# Patient Record
Sex: Female | Born: 1950 | Race: White | Hispanic: No | Marital: Married
Health system: Southern US, Community
[De-identification: ages and names within clinical notes are randomized; demographics above are authoritative.]

## PROBLEM LIST (undated history)

## (undated) DIAGNOSIS — M26629 Arthralgia of temporomandibular joint, unspecified side: Secondary | ICD-10-CM

## (undated) HISTORY — PX: BREAST EXCISIONAL BIOPSY: SUR124

---

## 1999-02-17 ENCOUNTER — Other Ambulatory Visit: Admission: RE | Admit: 1999-02-17 | Discharge: 1999-02-17 | Payer: Self-pay | Admitting: Family Medicine

## 1999-08-03 ENCOUNTER — Other Ambulatory Visit: Admission: RE | Admit: 1999-08-03 | Discharge: 1999-08-03 | Payer: Self-pay | Admitting: Obstetrics and Gynecology

## 1999-08-30 ENCOUNTER — Encounter: Admission: RE | Admit: 1999-08-30 | Discharge: 1999-08-30 | Payer: Self-pay | Admitting: Obstetrics and Gynecology

## 1999-08-30 ENCOUNTER — Encounter: Payer: Self-pay | Admitting: Obstetrics and Gynecology

## 1999-09-20 ENCOUNTER — Encounter (INDEPENDENT_AMBULATORY_CARE_PROVIDER_SITE_OTHER): Payer: Self-pay | Admitting: Specialist

## 1999-09-20 ENCOUNTER — Ambulatory Visit (HOSPITAL_COMMUNITY): Admission: RE | Admit: 1999-09-20 | Discharge: 1999-09-20 | Payer: Self-pay | Admitting: Obstetrics and Gynecology

## 2000-02-22 ENCOUNTER — Other Ambulatory Visit: Admission: RE | Admit: 2000-02-22 | Discharge: 2000-02-22 | Payer: Self-pay | Admitting: Obstetrics and Gynecology

## 2000-09-26 ENCOUNTER — Other Ambulatory Visit: Admission: RE | Admit: 2000-09-26 | Discharge: 2000-09-26 | Payer: Self-pay | Admitting: Obstetrics and Gynecology

## 2001-04-04 ENCOUNTER — Encounter: Admission: RE | Admit: 2001-04-04 | Discharge: 2001-04-04 | Payer: Self-pay | Admitting: Obstetrics and Gynecology

## 2001-04-04 ENCOUNTER — Encounter: Payer: Self-pay | Admitting: Obstetrics and Gynecology

## 2001-04-10 ENCOUNTER — Other Ambulatory Visit: Admission: RE | Admit: 2001-04-10 | Discharge: 2001-04-10 | Payer: Self-pay | Admitting: Obstetrics and Gynecology

## 2002-04-07 ENCOUNTER — Encounter: Payer: Self-pay | Admitting: Gynecology

## 2002-04-07 ENCOUNTER — Encounter: Admission: RE | Admit: 2002-04-07 | Discharge: 2002-04-07 | Payer: Self-pay | Admitting: Gynecology

## 2002-05-19 ENCOUNTER — Other Ambulatory Visit: Admission: RE | Admit: 2002-05-19 | Discharge: 2002-05-19 | Payer: Self-pay | Admitting: Gynecology

## 2003-08-17 ENCOUNTER — Other Ambulatory Visit: Admission: RE | Admit: 2003-08-17 | Discharge: 2003-08-17 | Payer: Self-pay | Admitting: Gynecology

## 2003-08-25 ENCOUNTER — Encounter: Admission: RE | Admit: 2003-08-25 | Discharge: 2003-08-25 | Payer: Self-pay | Admitting: Gynecology

## 2004-08-23 ENCOUNTER — Other Ambulatory Visit: Admission: RE | Admit: 2004-08-23 | Discharge: 2004-08-23 | Payer: Self-pay | Admitting: Gynecology

## 2004-08-26 ENCOUNTER — Encounter: Admission: RE | Admit: 2004-08-26 | Discharge: 2004-08-26 | Payer: Self-pay | Admitting: Gynecology

## 2005-08-30 ENCOUNTER — Other Ambulatory Visit: Admission: RE | Admit: 2005-08-30 | Discharge: 2005-08-30 | Payer: Self-pay | Admitting: Gynecology

## 2005-08-30 ENCOUNTER — Encounter: Admission: RE | Admit: 2005-08-30 | Discharge: 2005-08-30 | Payer: Self-pay | Admitting: Gynecology

## 2006-09-04 ENCOUNTER — Encounter: Admission: RE | Admit: 2006-09-04 | Discharge: 2006-09-04 | Payer: Self-pay | Admitting: Gynecology

## 2006-10-04 ENCOUNTER — Other Ambulatory Visit: Admission: RE | Admit: 2006-10-04 | Discharge: 2006-10-04 | Payer: Self-pay | Admitting: Gynecology

## 2007-10-07 ENCOUNTER — Encounter: Admission: RE | Admit: 2007-10-07 | Discharge: 2007-10-07 | Payer: Self-pay | Admitting: Gynecology

## 2008-02-24 ENCOUNTER — Other Ambulatory Visit: Admission: RE | Admit: 2008-02-24 | Discharge: 2008-02-24 | Payer: Self-pay | Admitting: Family Medicine

## 2008-03-05 ENCOUNTER — Encounter: Payer: Self-pay | Admitting: Emergency Medicine

## 2008-03-05 ENCOUNTER — Inpatient Hospital Stay (HOSPITAL_COMMUNITY): Admission: AD | Admit: 2008-03-05 | Discharge: 2008-03-08 | Payer: Self-pay | Admitting: Internal Medicine

## 2008-03-13 ENCOUNTER — Encounter: Admission: RE | Admit: 2008-03-13 | Discharge: 2008-03-13 | Payer: Self-pay | Admitting: Neurology

## 2008-10-30 ENCOUNTER — Encounter: Admission: RE | Admit: 2008-10-30 | Discharge: 2008-10-30 | Payer: Self-pay | Admitting: Family Medicine

## 2009-01-21 ENCOUNTER — Other Ambulatory Visit: Admission: RE | Admit: 2009-01-21 | Discharge: 2009-01-21 | Payer: Self-pay | Admitting: Family Medicine

## 2009-11-23 ENCOUNTER — Encounter: Admission: RE | Admit: 2009-11-23 | Discharge: 2009-11-23 | Payer: Self-pay | Admitting: Family Medicine

## 2010-01-25 ENCOUNTER — Other Ambulatory Visit: Admission: RE | Admit: 2010-01-25 | Discharge: 2010-01-25 | Payer: Self-pay | Admitting: Family Medicine

## 2010-10-04 NOTE — Discharge Summary (Signed)
[name], [name]                 ACCOUNT NO.:  [id number]   MEDICAL RECORD NO.:  [id number]          PATIENT TYPE:  INP   LOCATION:  3036                         FACILITY:  [facility]   PHYSICIAN:  Kela Millin, M.D.DATE OF BIRTH:  September 02, 1950   DATE OF ADMISSION:  03/05/2008  DATE OF DISCHARGE:  03/08/2008                               DISCHARGE SUMMARY   DISCHARGE DIAGNOSES:  1. 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  2. Hypokalemia - potassium replaced.  3. History of mitral valve prolapse.   CONSULTATIONS:  Neurology - Dr. Anne Hahn.   PROCEDURES AND STUDIES:  1. High-volume epidural blood patch.  2. MRI - no acute intracranial abnormality or focal lesion to account      for headaches.  3. MRA - normal variant.  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  4. CT scan of head without contrast - negative for bleed or other      acute intracranial process.   BRIEF HISTORY:  The patient is a 60 year old white female with the above  medical problems who presented with complaints of headaches.  She  reported that the headaches started while she was at the beach and later  on when she went to lie down, she then noticed that the headaches  improved and she was able to fall asleep and then subsequently it became  clear to her that sitting up made the headaches worse and laying down  made the headaches better.  She admitted to some nausea and vomiting x1.  On the day of admission, she woke up at 5 a.m. because of the severe  headache, and they decided to drive back from the beach and went to see  her primary care physician, who asked her to go to the ER.  She also was  complaining of some neck pain and in the ER wanted to do an LP but the  patient refused.  A CT scan was done and the results as stated above.  She was admitted for further evaluation and management.  She also  reported that she  had started having some difficulty hearing out of her  left ear in the few days prior to admission, and it just felt like it  needed to pop and she would be able to hear better.   Please see the full admission history and physical dictated on March 05, 2008 by Dr. Adela Glimpse for the details of admission physical exam as  well as the laboratory data.   HOSPITAL COURSE:  1. Headaches, traction type - upon admission the patient was placed on      analgesics for pain management.  An MRI was done and the results      are stated above - negative.  The patient continued to experience      the headaches which were worse with sitting up and also began      complaining of having difficulty hearing out of both ears.  Following the negative MRI, neurology was consulted and Dr. Anne Hahn      saw the patient and indicated that her headaches sounded like post      LP headaches, except that the patient had not had an LP.  He      initially indicated that he would still consider a lumbar puncture      to check the opening pressure, but again that the patient did not      want an LP.  Therefore, he recommended doing a high-volume epidural      blood patch and this was done and following this that the patient      has not had any further headaches, also no neck stiffness.  She was      seen on rounds today and has remained asymptomatic and      hemodynamically stable, and will be discharged home as per neuro      recommendations.  The patient has been instructed to try to keep      her head down today and then to get some on Monday, and then return      to work on Tuesday if no headaches.  2. Hypokalemia - her potassium was replaced in the hospital.   FOLLOW-UP CARE:  1. Dr. Gweneth Dimitri in 1 to 2 weeks.  2. Dr. Anne Hahn as needed.   DISCHARGE CONDITION:  Improved, stable.      Kela Millin, M.D.  Electronically Signed     ACV/MEDQ  D:  03/08/2008  T:  03/08/2008  Job:  [id number]   cc:    Pam Drown, M.D.  Marlan Palau, M.D.

## 2010-10-04 NOTE — Consult Note (Signed)
NAME:  CLARISE, CHACKO NO.:  [id number]   MEDICAL RECORD NO.:  [id number]          PATIENT TYPE:  INP   LOCATION:  3036                         FACILITY:  [facility]   PHYSICIAN:  Marlan Palau, M.D.  DATE OF BIRTH:  09-22-1950   DATE OF CONSULTATION:  03/06/2008  DATE OF DISCHARGE:                                 CONSULTATION   HISTORY OF PRESENT ILLNESS:  [name] is a 60 year old right-handed  white female born on 13-Feb-1951, with a history of headache that  began about 5 days prior to admission.  The patient initially noted a  mild tension-type headache in the head in the evening, took some  ibuprofen for it and went to bed and slept well.  The patient got up the  next morning and noted severe headache that was around the head  associated with some neck stiffness, particularly on the right side and  muffled hearing on the right greater than left side and eventually  muffled hearing involved the left as well.  The patient denied any ear  pain per se, but did note some neck stiffness and noted some pain down  the spine with the neck flexion.  The patient also noted that the  headache is worse with sitting or standing, better when lying down and  with coughing, the patient notes pain in the neck area.  The patient has  not any visual field changes, numbness or weakness on the arms or legs.  The patient has had some nausea and some occasional vomiting.  The  patient has no weakness of the extremities.  The patient comes to this  hospital for further evaluation.  MRI scan of the brain with and without  gadolinium enhancement and MRI angiogram of intracranial vessels have  been relatively unremarkable.  Lumbar puncture was suggested, but the  patient did not wish to have this test.  Neurology is asked to see the  patient for further evaluation.   PAST MEDICAL HISTORY:  Significant for:  1. New onset headache, traction type, etiology not clear.  2. Mitral valve  prolapse.  3. Left breast cyst biopsy.  4. Cervical biopsy.   The patient is on multivitamins prior to admission.  Again, the patient  does not smoke or drink.  She has no known allergies.   SOCIAL HISTORY:  This patient is married, lives in the [place], [place]  [place] area, has 2 children, works in a [employer].   FAMILY MEDICAL HISTORY:  Her mother is alive at 30 with stroke,  diabetes, hypertension, and atrial fibrillation.  Father, age 68 with  hypertension and congestive heart failure, still alive.  The patient has  2 sisters, 1 brother.  Brother has glaucoma and 1 sister has  hypertension.   REVIEW OF SYSTEMS:  Notable for no fevers or chills.  The patient denies  any significant history of headaches in the past.  Denies shortness of  breath, chest pains, or abdominal pain.  Some nausea is noted.  No  troubles controlling the bowels or bladder.  The patient  denies any  weakness or numbness on the arms or legs.  The gait disturbance and  blackout episodes.   PHYSICAL EXAMINATION:  VITAL SIGNS:  Blood pressure is currently 117/68,  heart rate 64, respiratory rate 16, and temperature is 99.1.  The  patient has been afebrile throughout the majority of the  hospitalization.  HEENT:  Head is atraumatic.  Eyes, pupils are equal, round, and reactive  to light.  Disks are flat.  No definite venous pulsations are seen.  NECK:  Supple.  No carotid bruits noted.  RESPIRATORY:  Clear.  CARDIOVASCULAR:  Regular rate and rhythm.  No obvious murmurs or rubs  noted.  EXTREMITIES:  Without significant edema.  NEUROLOGIC:  Cranial nerves as above.  Facial symmetry is present.  The  patient has good sensation of face to pinprick and soft touch  bilaterally.  She has good strength of facial muscle, motion of muscles  of head turn, and shoulder shrug bilaterally.  Speech is well  enunciated, not aphasic.  Motor testing reveals 5/5 strength in all  fours.  Good symmetric motor  tone is noted throughout.  Sensory testing  is intact to pinprick, soft touch, and vibratory sensation throughout.  The patient has good finger-nose-finger and heel-to-shin.  Gait was not  tested.  Again, no drift is seen in the arms.  Deep tendon reflexes  symmetric.  Normal toes downgoing bilaterally.   LABORATORY VALUES:  Notable for white count of 5.4, hemoglobin of 11.2,  hematocrit of 33.5, MCV of 90.0, and platelets of 145.  Sodium 137,  potassium 3.1, chloride of 104, CO2 of 27, glucose of 91, BUN of 7,  creatinine 0.74, total bili of 0.5, alk phosphatase 45, SGOT of 21, SGPT  of 16, total protein 6.5, albumin of 3.4, calcium of 8.7, phos of 3.5,  LDH of 103, magnesium 1.6, hemoglobin A1c of 5.4, cholesterol of 150,  triglycerides 54, HDL 45, LDL of 94, VLDL of 11, TSH of 3.396.  Urinalysis reveals specific gravity of 1.020, pH of 5.5, ketones 15  mg/dL, 3-6 white cells, and 0-2 red cells.   MRI is as above.   IMPRESSION:  History of headache of traction type, new onset.   The patient has a headache which is most consistent with a post-LP  headache, but the patient has not had a lumbar puncture.  MRI scan of  the brain does not show typical findings that one would expect with  spontaneous intracranial hypotension but the symptoms have only been  present for a few days.  I think this is still a diagnostic possibility.  I suppose that a small subarachnoid hemorrhage or low-grade aseptic  meningitis do need to be considered, but headaches with this generally  would not improve with lying down and get worse with sitting or  standing.  The patient does not wish to have a LP.   PLAN:  1. I would can still consider lumbar puncture and check opening      pressure.  2. IV fluid hydration.  3. Bedrest, head down.  4. Analgesics for pain.  We will review the MRI scan with the      radiologist.  If spontaneous intracranial hypotension remains a      possibility, a large volume blood  epidural may be beneficial.      C. Lesia Sago, M.D.  Electronically Signed     CKW/MEDQ  D:  03/06/2008  T:  03/07/2008  Job:  [id number]   cc:  [facility]

## 2010-10-04 NOTE — H&P (Signed)
NAMEPETRINA, MELBY NO.:  [id number]   MEDICAL RECORD NO.:  [id number]          PATIENT TYPE:  INP   LOCATION:  3036                         FACILITY:  [facility]   PHYSICIAN:  Michiel Cowboy, MDDATE OF BIRTH:  May 10, 1951   DATE OF ADMISSION:  03/05/2008  DATE OF DISCHARGE:                              HISTORY & PHYSICAL   PRIMARY CARE PHYSICIAN:  Pam Drown, M.D., [facility]   CHIEF COMPLAINT:  Headache.   The patient is a 60 year old female with no significant past medical  history, who was resting at the beach.  She was resting at the beach and  noticed while there she started to develop headache before she was going  to go to bed on Tuesday; she thought it was kind of a tension headache.  She took some ibuprofen.  At first there was probably some minimal  improvement and she was able to fall asleep.  She noticed that when she  was sitting up the headache would get worse, but when she would lay down  it would get better, particularly laying down on her stomach seemed to  make the headache go way.  She was able to rest more or less throughout  the day and tolerated the headache with occasional ibuprofen and  Tylenol; but the headache persisted, so that when she woke up she was  aware of it.  She had some nausea, actually vomited yesterday.  This  morning at about 5 o'clock in the morning she woke up because of her  headache was severe, which prompted her to drive back from the beach and  present to her regular physician; who instructed her to go to the  emergency department.  There initial lab work and CT scan was  unremarkable.  Because she was endorsing some neck pain.  They were  thinking about going ahead and doing a LP, but the patient refused; at  which point [facility] was called for an admission for further  observation.  The patient does drink caffeinated drinks, usually in the  morning.  She has not been drinking  much of that  while on vacation, but  she thought about that and went ahead and got herself a caffeinated  drink.  She thought this would help, but with no results.   Here the pain is relieved today with Percocet or morphine,  and  currently the patient is actually doing somewhat better.  She is not  nauseous.  She denies any fevers or chills.  She has not had chest pain  or shortness of breath.  Otherwise, review of systems were unremarkable  and no diarrhea.  Although, as I stated before, there was an episode of  nausea and vomiting.  She feels like her neck was a little bit more; she  had some pain in the back of her neck but able to move her neck in all  directions without trouble.  The patient has been noticing that she had  some difficulty in hearing from her left ear for the past few days too.  She feels  like she just needs to pop it and it will make it better.   PAST MEDICAL HISTORY:  Noncontributory.   SOCIAL HISTORY:  The patient never drinks alcohol and does not use  drugs.  Lives at home.  Has a very well involved family.  She does not  smoke and never did.   FAMILY HISTORY:  Noncontributory.   MEDICATIONS:  1. Tylenol and ibuprofen p.r.n.  2. Multivitamin.   ALLERGIES:  NO KNOWN DRUG ALLERGIES.   PHYSICAL EXAMINATION:  VITALS:  Temperature 99.5, heart rate 69,  respirations 18, blood pressure 110/66, saturations 97% on room air.  The patient appears to be in no acute distress currently and is resting  in bed.  HEAD:  Nontraumatic.  Somewhat dryish mucous membranes.  Palpation of  the head does not reproduce pain.  Palpation of her mastoid does not  reproduce pain.  The oropharynx does not seem to have any erythema or  exudates.  LUNGS:  Clear to auscultation bilaterally.  HEART:  Regular rate and rhythm.  No murmurs, rubs or gallops.  ABDOMEN:  Soft, nontender, nondistended.  EXTREMITIES:  No clubbing, cyanosis or edema.  NEUROLOGIC:  Strength 5 out of 5 in all 4 extremities.   Cranial II-XII  intact.  The neurological exam is completely nonfocal.   LABS:  White blood cell count 6.5, but with increased neutrophils at  81%, hemoglobin 12.2.  Sodium 144, potassium 3.5, creatinine 0.8.  LFTs  within normal limits.  EKG within normal limits.  Chest x-ray showing no  cardiopulmonary disease.  CT scan of the head negative for any  intracranial lesions, bleeding or remote strokes.   ASSESSMENT AND PLAN:  This is a 60 year old with new onset severe  headaches.  Etiology is unclear.  1. Headache.  I wonder if it is possible this is caffeine withdrawal      headache versus tension headache; but given the severity of it and      persistence, we will need to make sure that we rule out any      intracranial abnormality.  Will obtain MRI of the brain in the a.m.      Given some degree in hearing, I wonder if there is a possibility of      herpetic lesion in her ear; although not visible. but she would      probably need a further evaluation with an otoscope to see if there      is any herpetic lesions.  Also, that raises the possibility of      herpetic encephalitis.  The MRI should be able to help [place] determine      that.  If the patient becomes more toxic, for which she is not      currently, would need a LP.  We discussed this with the family and      the patient,  who states that yes if she will get sicker she would      be alright to have a LP; she just refuses to get it at this point.      Will control pain with Percocet.  Given that the patient is above      50, there is a possibility of temporal arteritis; although again,      the story is not very convincing.  We will check sed rate and also      check TSH and hemoglobin A1c -- although the presentation is not      very typical,  The patient appears to be clinically slightly      dehydrated.  I wonder if this is related to her headache.  We will      give her IV fluids and follow.   PROPHYLAXIS:  Protonix and  SCDs.      Michiel Cowboy, MD  Electronically Signed     AVD/MEDQ  D:  03/05/2008  T:  03/05/2008  Job:  [id number]   cc:   Pam Drown, M.D.

## 2010-10-07 NOTE — Op Note (Signed)
[facility]  Patient:    [name], [name]                        MRN: [id number] Proc. Date: 09/20/99 Adm. Date:  [id number] Disc. 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  Plus multiple endometrial polyps, history of irregular spotting in the last six weeks.  OPERATION:                    Cold knife conization; hysteroscopy and removal of multiple polyps in the endometrium.  SURGEON:                      Esmeralda Arthur, M.D.  ASSISTANT:  ANESTHESIA:                   General anesthesia.  PACKS:                        She had Surgicel in the cervical canal.  CATHETERS:                    None.  MEDIUM:                       Sorbitol with a deficit of 10 cc.  ESTIMATED BLOOD LOSS:  DESCRIPTION OF PROCEDURE:     The patient was carried to the operating room and  after satisfactory general anesthesia, she was prepped and draped in a sterile field and the bladder was emptied by catheterization.  Bimanual examination revealed the uterus to be posterior, no masses felt in the  adnexa.  A weighted speculum was placed in the posterior vagina.  The cervix was grasped at 3 and 9 with two single tooth tenaculi.  The cervix was then stained with lugols and took up the stain.  The cervix was then injected with 20 drops of vasopressin in 100 cc and 2 cc were injected at 3, 6, 9, and we lost some at 12, so I injected more.  We then took the Bard-Parker and starting at 6 oclock went counterclockwise to 2 and then at 6 oclock clockwise to 12.  The anterior and posterior lips of the cervix of the cone was then grasped and the remainder of the dissection  was done with scissors.  I did not get as much endocervical canal posteriorly as I would  like to, but we did get it and removed it.  We tagged the cone at 12 oclock and  sent it fresh to the lab.  We then dilated the cervix to 23 Hegar and inserted the hysteroscope and initially hard to see because she was so posterior, but we found that she had multiple polyps and these were removed with Randall stonegrasping forceps and then we had a curettement.  I looked again and I could see no polyps.  We then did needlepoint cautery on the cervix.  We then put sutures from 1 to 5 and 7 to 10  and #1 chromic and tied these.  I then looked at the cervix and could see no bleeding after a minute.  We then packed the cervix with Surgicel.  The patient was carried to the recovery room in good condition. DD:  09/20/99 TD:  09/22/99 Job: [id number]

## 2010-11-24 ENCOUNTER — Other Ambulatory Visit: Payer: Self-pay | Admitting: Family Medicine

## 2010-11-24 DIAGNOSIS — Z1231 Encounter for screening mammogram for malignant neoplasm of breast: Secondary | ICD-10-CM

## 2010-11-28 ENCOUNTER — Ambulatory Visit
Admission: RE | Admit: 2010-11-28 | Discharge: 2010-11-28 | Disposition: A | Discharge status: Home / Self Care | Payer: 59 | Source: Ambulatory Visit | Attending: Family Medicine | Admitting: Family Medicine

## 2010-11-28 ENCOUNTER — Other Ambulatory Visit: Payer: Self-pay | Admitting: Family Medicine

## 2010-11-28 DIAGNOSIS — Z1231 Encounter for screening mammogram for malignant neoplasm of breast: Secondary | ICD-10-CM

## 2011-01-30 ENCOUNTER — Other Ambulatory Visit: Payer: Self-pay | Admitting: Family Medicine

## 2011-01-30 DIAGNOSIS — N9489 Other specified conditions associated with female genital organs and menstrual cycle: Secondary | ICD-10-CM

## 2011-02-07 ENCOUNTER — Other Ambulatory Visit: Payer: 59

## 2011-02-09 ENCOUNTER — Other Ambulatory Visit: Payer: 59

## 2011-02-09 ENCOUNTER — Ambulatory Visit
Admission: RE | Admit: 2011-02-09 | Discharge: 2011-02-09 | Disposition: A | Discharge status: Home / Self Care | Payer: 59 | Source: Ambulatory Visit | Attending: Family Medicine | Admitting: Family Medicine

## 2011-02-09 DIAGNOSIS — N9489 Other specified conditions associated with female genital organs and menstrual cycle: Secondary | ICD-10-CM

## 2011-02-21 LAB — DIFFERENTIAL
Basophils Absolute: 0
Basophils Absolute: 0
Basophils Relative: 0
Eosinophils Absolute: 0
Lymphocytes Relative: 13
Lymphocytes Relative: 13
Lymphs Abs: 0.7
Monocytes Absolute: 0.3
Monocytes Relative: 6
Neutro Abs: 5.3

## 2011-02-21 LAB — BASIC METABOLIC PANEL
CO2: 30
Chloride: 104
GFR calc Af Amer: >60
Sodium: 141

## 2011-02-21 LAB — URINE CULTURE

## 2011-02-21 LAB — URINE MICROSCOPIC-ADD ON

## 2011-02-21 LAB — URINALYSIS, ROUTINE W REFLEX MICROSCOPIC
Bilirubin Urine: NEGATIVE
Glucose, UA: NEGATIVE
Nitrite: NEGATIVE
Specific Gravity, Urine: 1.02
pH: 5.5

## 2011-02-21 LAB — CBC
HCT: 35.2 — ABNORMAL LOW
HCT: 35.6 — ABNORMAL LOW
Hemoglobin: 11.2 — ABNORMAL LOW
Hemoglobin: 11.8 — ABNORMAL LOW
MCHC: 33.2
MCHC: 34.6
MCV: 87.3
MCV: 89.9
Platelets: 193
RBC: 3.95
RDW: 12.3
WBC: 5.4

## 2011-02-21 LAB — SEDIMENTATION RATE: Sed Rate: 15

## 2011-02-21 LAB — COMPREHENSIVE METABOLIC PANEL
ALT: 16
Albumin: 3.4 — ABNORMAL LOW
Albumin: 4.1
BUN: 7
BUN: 9
Chloride: 104
Chloride: 106
Creatinine, Ser: 0.74
Creatinine, Ser: 0.8
Glucose, Bld: 91
Sodium: 137
Total Bilirubin: 0.5
Total Bilirubin: 0.5
Total Protein: 7.6

## 2011-02-21 LAB — LIPID PANEL
LDL Cholesterol: 94
VLDL: 11

## 2011-02-21 LAB — APTT: aPTT: 26

## 2011-02-21 LAB — MAGNESIUM: Magnesium: 1.6

## 2011-02-21 LAB — HEMOGLOBIN A1C: Hgb A1c MFr Bld: 5.4

## 2011-02-21 LAB — PHOSPHORUS: Phosphorus: 3.5

## 2011-02-21 LAB — LACTATE DEHYDROGENASE: LDH: 103

## 2011-02-21 LAB — TSH: TSH: 3.396

## 2011-12-21 ENCOUNTER — Other Ambulatory Visit: Payer: Self-pay | Admitting: Family Medicine

## 2011-12-21 DIAGNOSIS — Z1231 Encounter for screening mammogram for malignant neoplasm of breast: Secondary | ICD-10-CM

## 2012-01-05 ENCOUNTER — Ambulatory Visit: Payer: 59

## 2012-01-16 ENCOUNTER — Ambulatory Visit
Admission: RE | Admit: 2012-01-16 | Discharge: 2012-01-16 | Disposition: A | Discharge status: Home / Self Care | Payer: BC Managed Care – PPO | Source: Ambulatory Visit | Attending: Family Medicine | Admitting: Family Medicine

## 2012-01-16 DIAGNOSIS — Z1231 Encounter for screening mammogram for malignant neoplasm of breast: Secondary | ICD-10-CM

## 2012-10-07 ENCOUNTER — Other Ambulatory Visit (HOSPITAL_COMMUNITY)
Admission: RE | Admit: 2012-10-07 | Discharge: 2012-10-07 | Disposition: A | Discharge status: Home / Self Care | Payer: BC Managed Care – PPO | Source: Ambulatory Visit | Attending: Family Medicine | Admitting: Family Medicine

## 2012-10-07 ENCOUNTER — Other Ambulatory Visit: Payer: Self-pay | Admitting: Family Medicine

## 2012-10-07 DIAGNOSIS — Z0000 Encounter for general adult medical examination without abnormal findings: Secondary | ICD-10-CM | POA: Insufficient documentation

## 2012-10-07 DIAGNOSIS — Z1151 Encounter for screening for human papillomavirus (HPV): Secondary | ICD-10-CM | POA: Insufficient documentation

## 2013-05-06 ENCOUNTER — Other Ambulatory Visit: Payer: Self-pay

## 2013-05-06 DIAGNOSIS — Z1231 Encounter for screening mammogram for malignant neoplasm of breast: Secondary | ICD-10-CM

## 2013-05-14 ENCOUNTER — Ambulatory Visit
Admission: RE | Admit: 2013-05-14 | Discharge: 2013-05-14 | Disposition: A | Discharge status: Home / Self Care | Payer: BC Managed Care – PPO | Source: Ambulatory Visit

## 2013-05-14 DIAGNOSIS — Z1231 Encounter for screening mammogram for malignant neoplasm of breast: Secondary | ICD-10-CM

## 2014-04-23 ENCOUNTER — Other Ambulatory Visit: Payer: Self-pay

## 2014-04-23 DIAGNOSIS — Z1231 Encounter for screening mammogram for malignant neoplasm of breast: Secondary | ICD-10-CM

## 2014-05-18 ENCOUNTER — Ambulatory Visit
Admission: RE | Admit: 2014-05-18 | Discharge: 2014-05-18 | Disposition: A | Discharge status: Home / Self Care | Payer: BC Managed Care – PPO | Source: Ambulatory Visit

## 2014-05-18 DIAGNOSIS — Z1231 Encounter for screening mammogram for malignant neoplasm of breast: Secondary | ICD-10-CM

## 2015-05-14 ENCOUNTER — Other Ambulatory Visit: Payer: Self-pay

## 2015-05-14 DIAGNOSIS — Z1231 Encounter for screening mammogram for malignant neoplasm of breast: Secondary | ICD-10-CM

## 2015-06-14 ENCOUNTER — Ambulatory Visit
Admission: RE | Admit: 2015-06-14 | Discharge: 2015-06-14 | Disposition: A | Discharge status: Home / Self Care | Payer: BLUE CROSS/BLUE SHIELD | Source: Ambulatory Visit

## 2015-06-14 DIAGNOSIS — Z1231 Encounter for screening mammogram for malignant neoplasm of breast: Secondary | ICD-10-CM

## 2016-07-20 ENCOUNTER — Other Ambulatory Visit: Payer: Self-pay | Admitting: Family Medicine

## 2016-07-20 DIAGNOSIS — Z1231 Encounter for screening mammogram for malignant neoplasm of breast: Secondary | ICD-10-CM

## 2016-08-07 ENCOUNTER — Ambulatory Visit
Admission: RE | Admit: 2016-08-07 | Discharge: 2016-08-07 | Disposition: A | Discharge status: Home / Self Care | Payer: BLUE CROSS/BLUE SHIELD | Source: Ambulatory Visit | Attending: Family Medicine | Admitting: Family Medicine

## 2016-08-07 DIAGNOSIS — Z1231 Encounter for screening mammogram for malignant neoplasm of breast: Secondary | ICD-10-CM

## 2016-08-09 ENCOUNTER — Other Ambulatory Visit: Payer: Self-pay | Admitting: Family Medicine

## 2016-08-09 DIAGNOSIS — R928 Other abnormal and inconclusive findings on diagnostic imaging of breast: Secondary | ICD-10-CM

## 2016-08-15 ENCOUNTER — Ambulatory Visit
Admission: RE | Admit: 2016-08-15 | Discharge: 2016-08-15 | Disposition: A | Discharge status: Home / Self Care | Payer: BLUE CROSS/BLUE SHIELD | Source: Ambulatory Visit | Attending: Family Medicine | Admitting: Family Medicine

## 2016-08-15 DIAGNOSIS — R928 Other abnormal and inconclusive findings on diagnostic imaging of breast: Secondary | ICD-10-CM

## 2017-08-10 ENCOUNTER — Other Ambulatory Visit: Payer: Self-pay | Admitting: Family Medicine

## 2017-08-10 DIAGNOSIS — Z1231 Encounter for screening mammogram for malignant neoplasm of breast: Secondary | ICD-10-CM

## 2017-08-20 DIAGNOSIS — R69 Illness, unspecified: Secondary | ICD-10-CM | POA: Diagnosis not present

## 2017-08-20 DIAGNOSIS — D1039 Benign neoplasm of other parts of mouth: Secondary | ICD-10-CM | POA: Diagnosis not present

## 2017-09-10 ENCOUNTER — Other Ambulatory Visit: Payer: Self-pay | Admitting: Oral Surgery

## 2017-09-10 DIAGNOSIS — K068 Other specified disorders of gingiva and edentulous alveolar ridge: Secondary | ICD-10-CM | POA: Diagnosis not present

## 2017-09-10 DIAGNOSIS — D1039 Benign neoplasm of other parts of mouth: Secondary | ICD-10-CM | POA: Diagnosis not present

## 2017-09-12 ENCOUNTER — Ambulatory Visit
Admission: RE | Admit: 2017-09-12 | Discharge: 2017-09-12 | Disposition: A | Discharge status: Home / Self Care | Payer: Medicare HMO | Source: Ambulatory Visit | Attending: Family Medicine | Admitting: Family Medicine

## 2017-09-12 DIAGNOSIS — Z1231 Encounter for screening mammogram for malignant neoplasm of breast: Secondary | ICD-10-CM

## 2017-11-05 ENCOUNTER — Other Ambulatory Visit (HOSPITAL_COMMUNITY)
Admission: RE | Admit: 2017-11-05 | Discharge: 2017-11-05 | Disposition: A | Discharge status: Home / Self Care | Payer: Medicare HMO | Source: Ambulatory Visit | Attending: Family Medicine | Admitting: Family Medicine

## 2017-11-05 ENCOUNTER — Other Ambulatory Visit: Payer: Self-pay | Admitting: Family Medicine

## 2017-11-05 DIAGNOSIS — Z01419 Encounter for gynecological examination (general) (routine) without abnormal findings: Secondary | ICD-10-CM | POA: Diagnosis not present

## 2017-11-05 DIAGNOSIS — Z124 Encounter for screening for malignant neoplasm of cervix: Secondary | ICD-10-CM | POA: Diagnosis not present

## 2017-11-05 DIAGNOSIS — Z1159 Encounter for screening for other viral diseases: Secondary | ICD-10-CM | POA: Diagnosis not present

## 2017-11-05 DIAGNOSIS — Z131 Encounter for screening for diabetes mellitus: Secondary | ICD-10-CM | POA: Diagnosis not present

## 2017-11-05 DIAGNOSIS — R6884 Jaw pain: Secondary | ICD-10-CM | POA: Diagnosis not present

## 2017-11-05 DIAGNOSIS — Z23 Encounter for immunization: Secondary | ICD-10-CM | POA: Diagnosis not present

## 2017-11-05 DIAGNOSIS — Z0000 Encounter for general adult medical examination without abnormal findings: Secondary | ICD-10-CM | POA: Diagnosis not present

## 2017-11-05 DIAGNOSIS — E559 Vitamin D deficiency, unspecified: Secondary | ICD-10-CM | POA: Diagnosis not present

## 2017-11-07 LAB — CYTOLOGY - PAP: Diagnosis: NEGATIVE

## 2017-11-15 DIAGNOSIS — G500 Trigeminal neuralgia: Secondary | ICD-10-CM | POA: Diagnosis not present

## 2017-11-15 DIAGNOSIS — G44099 Other trigeminal autonomic cephalgias (TAC), not intractable: Secondary | ICD-10-CM | POA: Diagnosis not present

## 2017-11-15 DIAGNOSIS — G509 Disorder of trigeminal nerve, unspecified: Secondary | ICD-10-CM | POA: Diagnosis not present

## 2017-12-26 DIAGNOSIS — R69 Illness, unspecified: Secondary | ICD-10-CM | POA: Diagnosis not present

## 2018-01-17 DIAGNOSIS — R69 Illness, unspecified: Secondary | ICD-10-CM | POA: Diagnosis not present

## 2018-02-14 DIAGNOSIS — Z131 Encounter for screening for diabetes mellitus: Secondary | ICD-10-CM | POA: Diagnosis not present

## 2018-02-14 DIAGNOSIS — Z0000 Encounter for general adult medical examination without abnormal findings: Secondary | ICD-10-CM | POA: Diagnosis not present

## 2018-02-14 DIAGNOSIS — Z124 Encounter for screening for malignant neoplasm of cervix: Secondary | ICD-10-CM | POA: Diagnosis not present

## 2018-02-14 DIAGNOSIS — E559 Vitamin D deficiency, unspecified: Secondary | ICD-10-CM | POA: Diagnosis not present

## 2018-02-14 DIAGNOSIS — Z1159 Encounter for screening for other viral diseases: Secondary | ICD-10-CM | POA: Diagnosis not present

## 2018-02-14 DIAGNOSIS — Z23 Encounter for immunization: Secondary | ICD-10-CM | POA: Diagnosis not present

## 2018-02-14 DIAGNOSIS — R6884 Jaw pain: Secondary | ICD-10-CM | POA: Diagnosis not present

## 2018-07-03 DIAGNOSIS — R69 Illness, unspecified: Secondary | ICD-10-CM | POA: Diagnosis not present

## 2018-07-08 DIAGNOSIS — H52201 Unspecified astigmatism, right eye: Secondary | ICD-10-CM | POA: Diagnosis not present

## 2018-07-08 DIAGNOSIS — H52202 Unspecified astigmatism, left eye: Secondary | ICD-10-CM | POA: Diagnosis not present

## 2018-08-02 ENCOUNTER — Other Ambulatory Visit: Payer: Self-pay | Admitting: Family Medicine

## 2018-08-02 DIAGNOSIS — E2839 Other primary ovarian failure: Secondary | ICD-10-CM

## 2018-08-02 DIAGNOSIS — Z1231 Encounter for screening mammogram for malignant neoplasm of breast: Secondary | ICD-10-CM

## 2018-09-23 ENCOUNTER — Other Ambulatory Visit: Payer: Medicare HMO

## 2018-09-23 ENCOUNTER — Ambulatory Visit: Payer: Medicare HMO

## 2018-11-06 ENCOUNTER — Ambulatory Visit
Admission: RE | Admit: 2018-11-06 | Discharge: 2018-11-06 | Disposition: A | Discharge status: Home / Self Care | Payer: Medicare HMO | Source: Ambulatory Visit | Attending: Family Medicine | Admitting: Family Medicine

## 2018-11-06 ENCOUNTER — Other Ambulatory Visit: Payer: Self-pay

## 2018-11-06 DIAGNOSIS — Z780 Asymptomatic menopausal state: Secondary | ICD-10-CM | POA: Diagnosis not present

## 2018-11-06 DIAGNOSIS — Z1231 Encounter for screening mammogram for malignant neoplasm of breast: Secondary | ICD-10-CM | POA: Diagnosis not present

## 2018-11-06 DIAGNOSIS — M85851 Other specified disorders of bone density and structure, right thigh: Secondary | ICD-10-CM | POA: Diagnosis not present

## 2018-11-06 DIAGNOSIS — E2839 Other primary ovarian failure: Secondary | ICD-10-CM

## 2019-01-01 DIAGNOSIS — R69 Illness, unspecified: Secondary | ICD-10-CM | POA: Diagnosis not present

## 2019-02-05 DIAGNOSIS — R69 Illness, unspecified: Secondary | ICD-10-CM | POA: Diagnosis not present

## 2019-03-07 DIAGNOSIS — R69 Illness, unspecified: Secondary | ICD-10-CM | POA: Diagnosis not present

## 2019-03-13 DIAGNOSIS — Z23 Encounter for immunization: Secondary | ICD-10-CM | POA: Diagnosis not present

## 2019-03-13 DIAGNOSIS — E559 Vitamin D deficiency, unspecified: Secondary | ICD-10-CM | POA: Diagnosis not present

## 2019-03-13 DIAGNOSIS — G500 Trigeminal neuralgia: Secondary | ICD-10-CM | POA: Diagnosis not present

## 2019-03-13 DIAGNOSIS — Z0000 Encounter for general adult medical examination without abnormal findings: Secondary | ICD-10-CM | POA: Diagnosis not present

## 2019-03-13 DIAGNOSIS — Z131 Encounter for screening for diabetes mellitus: Secondary | ICD-10-CM | POA: Diagnosis not present

## 2019-03-13 DIAGNOSIS — Z1389 Encounter for screening for other disorder: Secondary | ICD-10-CM | POA: Diagnosis not present

## 2019-04-11 ENCOUNTER — Other Ambulatory Visit: Payer: Self-pay

## 2019-04-11 DIAGNOSIS — Z20822 Contact with and (suspected) exposure to covid-19: Secondary | ICD-10-CM

## 2019-04-13 LAB — NOVEL CORONAVIRUS, NAA: SARS-CoV-2, NAA: NOT DETECTED

## 2019-04-15 DIAGNOSIS — R69 Illness, unspecified: Secondary | ICD-10-CM | POA: Diagnosis not present

## 2019-05-05 DIAGNOSIS — R69 Illness, unspecified: Secondary | ICD-10-CM | POA: Diagnosis not present

## 2019-06-20 ENCOUNTER — Ambulatory Visit: Payer: Medicare HMO

## 2019-06-28 ENCOUNTER — Ambulatory Visit: Payer: Medicare HMO | Attending: Internal Medicine

## 2019-06-28 DIAGNOSIS — Z23 Encounter for immunization: Secondary | ICD-10-CM | POA: Insufficient documentation

## 2019-06-28 NOTE — Progress Notes (Signed)
   Covid-19 Vaccination Clinic  Name:  [name]    MRN: [id number] DOB: 1951/03/21  06/28/2019  [name] was observed post Covid-19 immunization for 15 minutes without incidence. She was provided with Vaccine Information Sheet and instruction to access the V-Safe system.   [name] was instructed to call 911 with any severe reactions post vaccine: Marland Kitchen Difficulty breathing  . Swelling of your face and throat  . A fast heartbeat  . A bad rash all over your body  . Dizziness and weakness    Immunizations Administered    Name Date Dose VIS Date Route   Pfizer COVID-19 Vaccine 06/28/2019 12:49 PM 0.3 mL 05/02/2019 Intramuscular   Manufacturer: [place]   Lot: [id number]   [place]: [id number]

## 2019-07-01 ENCOUNTER — Ambulatory Visit: Payer: Medicare HMO

## 2019-07-11 DIAGNOSIS — Z0100 Encounter for examination of eyes and vision without abnormal findings: Secondary | ICD-10-CM | POA: Diagnosis not present

## 2019-07-23 ENCOUNTER — Ambulatory Visit: Payer: Medicare HMO | Attending: Internal Medicine

## 2019-07-23 DIAGNOSIS — Z23 Encounter for immunization: Secondary | ICD-10-CM | POA: Insufficient documentation

## 2019-07-23 NOTE — Progress Notes (Signed)
   Covid-19 Vaccination Clinic  Name:  [name]    MRN: [id number] DOB: [birth year]  07/23/2019  [name] was observed post Covid-19 immunization for 15 minutes without incident. She was provided with Vaccine Information Sheet and instruction to access the V-Safe system.   [name] was instructed to call 911 with any severe reactions post vaccine: Marland Kitchen Difficulty breathing  . Swelling of face and throat  . A fast heartbeat  . A bad rash all over body  . Dizziness and weakness   Immunizations Administered    Name Date Dose VIS Date Route   Pfizer COVID-19 Vaccine 07/23/2019  2:56 PM 0.3 mL 05/02/2019 Intramuscular   Manufacturer: [place]   Lot: [id number]   [place]: [id number]

## 2019-08-15 DIAGNOSIS — G500 Trigeminal neuralgia: Secondary | ICD-10-CM | POA: Diagnosis not present

## 2019-09-04 DIAGNOSIS — R69 Illness, unspecified: Secondary | ICD-10-CM | POA: Diagnosis not present

## 2019-10-27 DIAGNOSIS — G500 Trigeminal neuralgia: Secondary | ICD-10-CM | POA: Diagnosis not present

## 2019-10-28 DIAGNOSIS — G500 Trigeminal neuralgia: Secondary | ICD-10-CM | POA: Diagnosis not present

## 2019-10-28 DIAGNOSIS — Z510 Encounter for antineoplastic radiation therapy: Secondary | ICD-10-CM | POA: Diagnosis not present

## 2020-01-02 ENCOUNTER — Other Ambulatory Visit: Payer: Self-pay | Admitting: Family Medicine

## 2020-01-02 DIAGNOSIS — Z1231 Encounter for screening mammogram for malignant neoplasm of breast: Secondary | ICD-10-CM

## 2020-01-09 DIAGNOSIS — H2513 Age-related nuclear cataract, bilateral: Secondary | ICD-10-CM | POA: Diagnosis not present

## 2020-01-09 DIAGNOSIS — H40013 Open angle with borderline findings, low risk, bilateral: Secondary | ICD-10-CM | POA: Diagnosis not present

## 2020-01-09 DIAGNOSIS — H04123 Dry eye syndrome of bilateral lacrimal glands: Secondary | ICD-10-CM | POA: Diagnosis not present

## 2020-01-13 ENCOUNTER — Other Ambulatory Visit: Payer: Self-pay

## 2020-01-13 ENCOUNTER — Ambulatory Visit
Admission: RE | Admit: 2020-01-13 | Discharge: 2020-01-13 | Disposition: A | Discharge status: Home / Self Care | Payer: Medicare HMO | Source: Ambulatory Visit | Attending: Family Medicine | Admitting: Family Medicine

## 2020-01-13 DIAGNOSIS — Z1231 Encounter for screening mammogram for malignant neoplasm of breast: Secondary | ICD-10-CM | POA: Diagnosis not present

## 2020-02-17 DIAGNOSIS — Z20828 Contact with and (suspected) exposure to other viral communicable diseases: Secondary | ICD-10-CM | POA: Diagnosis not present

## 2020-03-04 DIAGNOSIS — G500 Trigeminal neuralgia: Secondary | ICD-10-CM | POA: Diagnosis not present

## 2020-03-04 DIAGNOSIS — Z923 Personal history of irradiation: Secondary | ICD-10-CM | POA: Diagnosis not present

## 2020-03-04 DIAGNOSIS — Z48811 Encounter for surgical aftercare following surgery on the nervous system: Secondary | ICD-10-CM | POA: Diagnosis not present

## 2020-03-06 ENCOUNTER — Other Ambulatory Visit: Payer: Self-pay

## 2020-03-06 ENCOUNTER — Ambulatory Visit: Payer: Medicare HMO | Attending: Internal Medicine

## 2020-03-06 DIAGNOSIS — Z23 Encounter for immunization: Secondary | ICD-10-CM

## 2020-03-06 NOTE — Progress Notes (Signed)
   Covid-19 Vaccination Clinic  Name:  Corissa Oguinn    MRN: [id number] DOB: July 06, 1950  03/06/2020  Ms. Soderholm was observed post Covid-19 immunization for 15 minutes without incident. She was provided with Vaccine Information Sheet and instruction to access the V-Safe system.   Ms. Mcauliff was instructed to call 911 with any severe reactions post vaccine: Marland Kitchen Difficulty breathing  . Swelling of face and throat  . A fast heartbeat  . A bad rash all over body  . Dizziness and weakness

## 2020-03-10 DIAGNOSIS — Z131 Encounter for screening for diabetes mellitus: Secondary | ICD-10-CM | POA: Diagnosis not present

## 2020-03-10 DIAGNOSIS — E559 Vitamin D deficiency, unspecified: Secondary | ICD-10-CM | POA: Diagnosis not present

## 2020-03-15 DIAGNOSIS — Z01419 Encounter for gynecological examination (general) (routine) without abnormal findings: Secondary | ICD-10-CM | POA: Diagnosis not present

## 2020-03-15 DIAGNOSIS — G500 Trigeminal neuralgia: Secondary | ICD-10-CM | POA: Diagnosis not present

## 2020-03-15 DIAGNOSIS — Z0000 Encounter for general adult medical examination without abnormal findings: Secondary | ICD-10-CM | POA: Diagnosis not present

## 2020-03-15 DIAGNOSIS — Z131 Encounter for screening for diabetes mellitus: Secondary | ICD-10-CM | POA: Diagnosis not present

## 2020-03-15 DIAGNOSIS — E559 Vitamin D deficiency, unspecified: Secondary | ICD-10-CM | POA: Diagnosis not present

## 2020-03-15 DIAGNOSIS — Z1389 Encounter for screening for other disorder: Secondary | ICD-10-CM | POA: Diagnosis not present

## 2020-03-15 DIAGNOSIS — Z23 Encounter for immunization: Secondary | ICD-10-CM | POA: Diagnosis not present

## 2020-03-16 DIAGNOSIS — R69 Illness, unspecified: Secondary | ICD-10-CM | POA: Diagnosis not present

## 2020-04-14 DIAGNOSIS — R69 Illness, unspecified: Secondary | ICD-10-CM | POA: Diagnosis not present

## 2020-04-29 DIAGNOSIS — Z923 Personal history of irradiation: Secondary | ICD-10-CM | POA: Diagnosis not present

## 2020-04-29 DIAGNOSIS — G500 Trigeminal neuralgia: Secondary | ICD-10-CM | POA: Diagnosis not present

## 2020-07-15 DIAGNOSIS — H04123 Dry eye syndrome of bilateral lacrimal glands: Secondary | ICD-10-CM | POA: Diagnosis not present

## 2020-07-15 DIAGNOSIS — H35363 Drusen (degenerative) of macula, bilateral: Secondary | ICD-10-CM | POA: Diagnosis not present

## 2020-07-15 DIAGNOSIS — Z0100 Encounter for examination of eyes and vision without abnormal findings: Secondary | ICD-10-CM | POA: Diagnosis not present

## 2020-07-15 DIAGNOSIS — H40013 Open angle with borderline findings, low risk, bilateral: Secondary | ICD-10-CM | POA: Diagnosis not present

## 2020-07-15 DIAGNOSIS — H2513 Age-related nuclear cataract, bilateral: Secondary | ICD-10-CM | POA: Diagnosis not present

## 2020-11-25 IMAGING — MG DIGITAL SCREENING BILAT W/ TOMO W/ CAD
6 of 10 series · 6 of 30 positions shown · non-contrast
Comparison: Previous exam(s).

CLINICAL DATA: Screening.

EXAM:
DIGITAL SCREENING BILATERAL MAMMOGRAM WITH TOMO AND CAD

[R CC synth-2D]
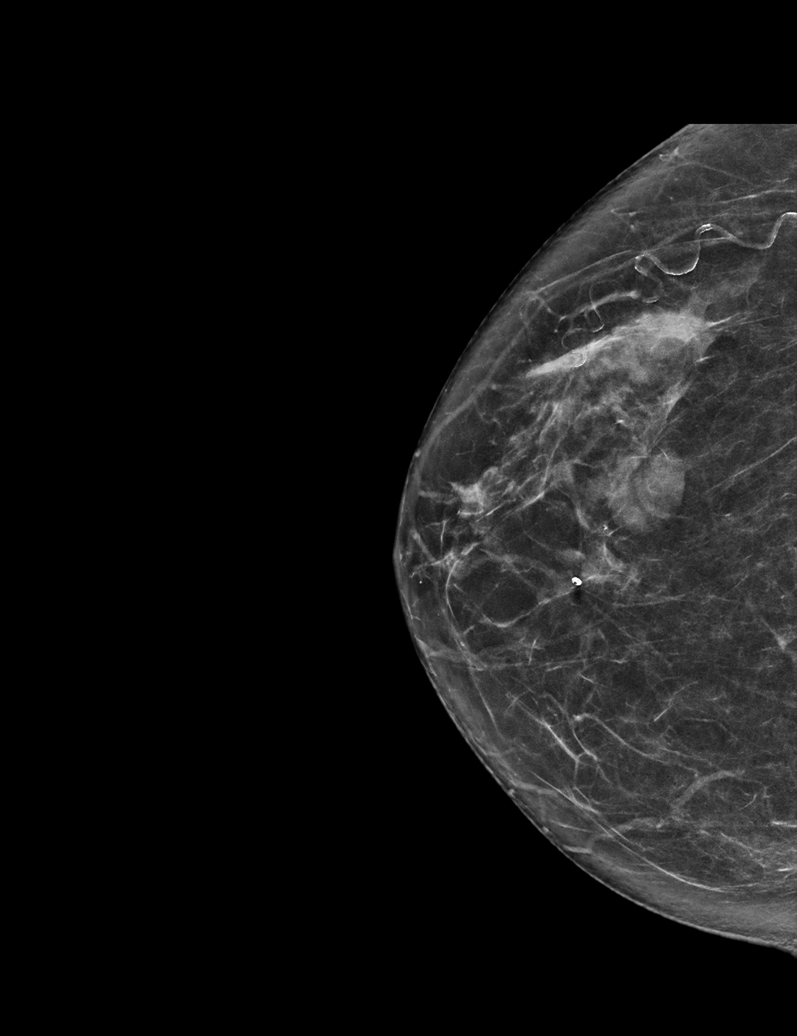

[L MLO synth-2D]
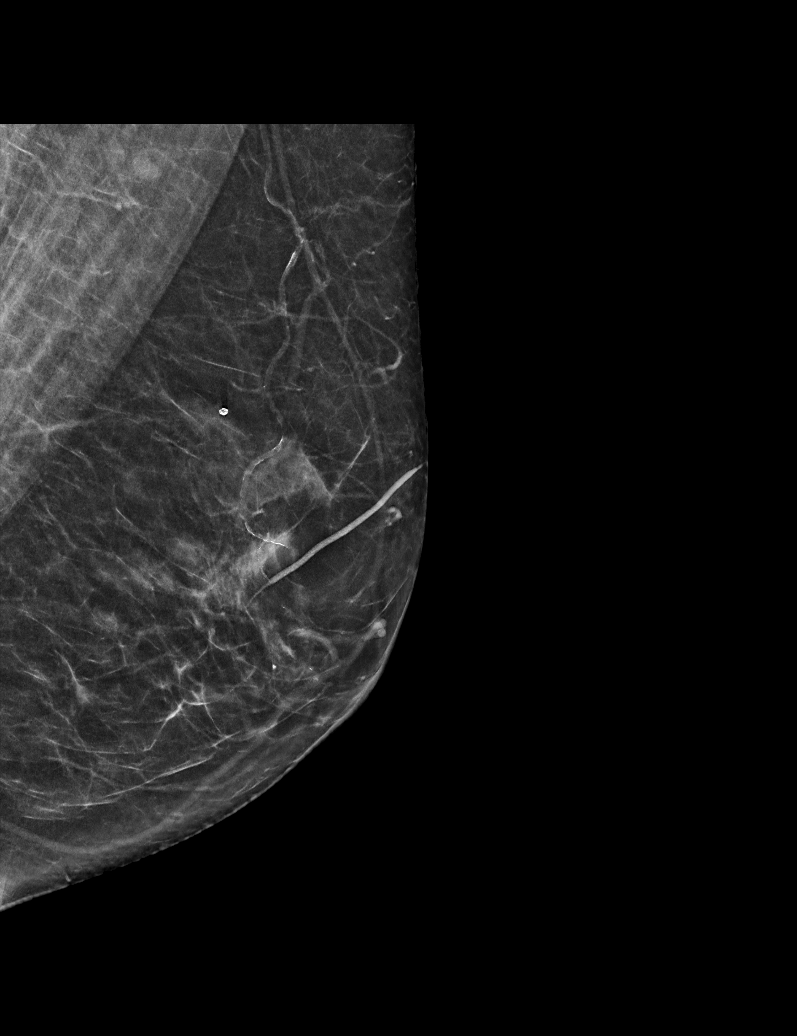

[L CC synth-2D]
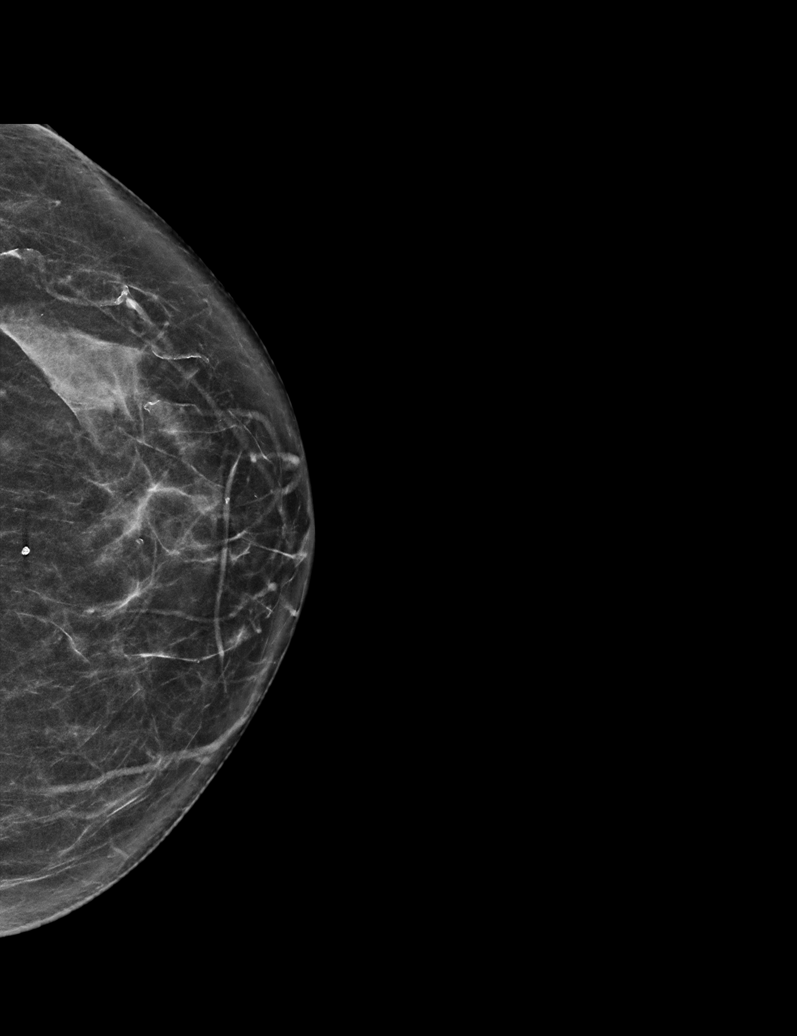

[R MLO synth-2D]
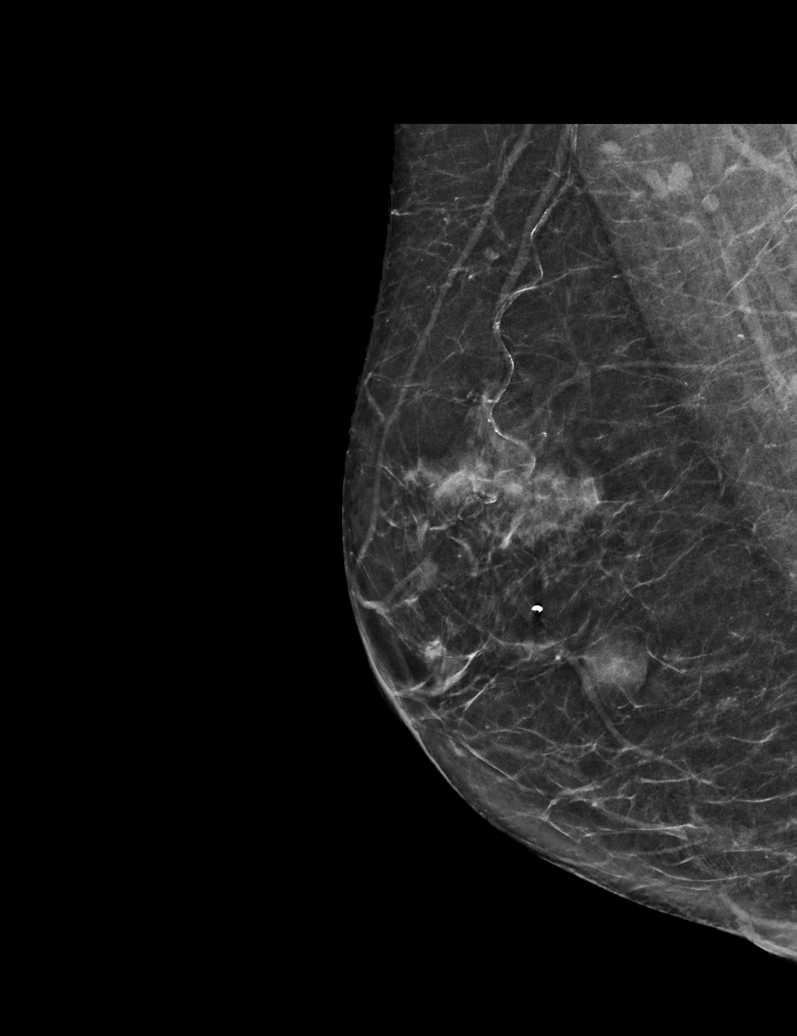

[L XCCL synth-2D]
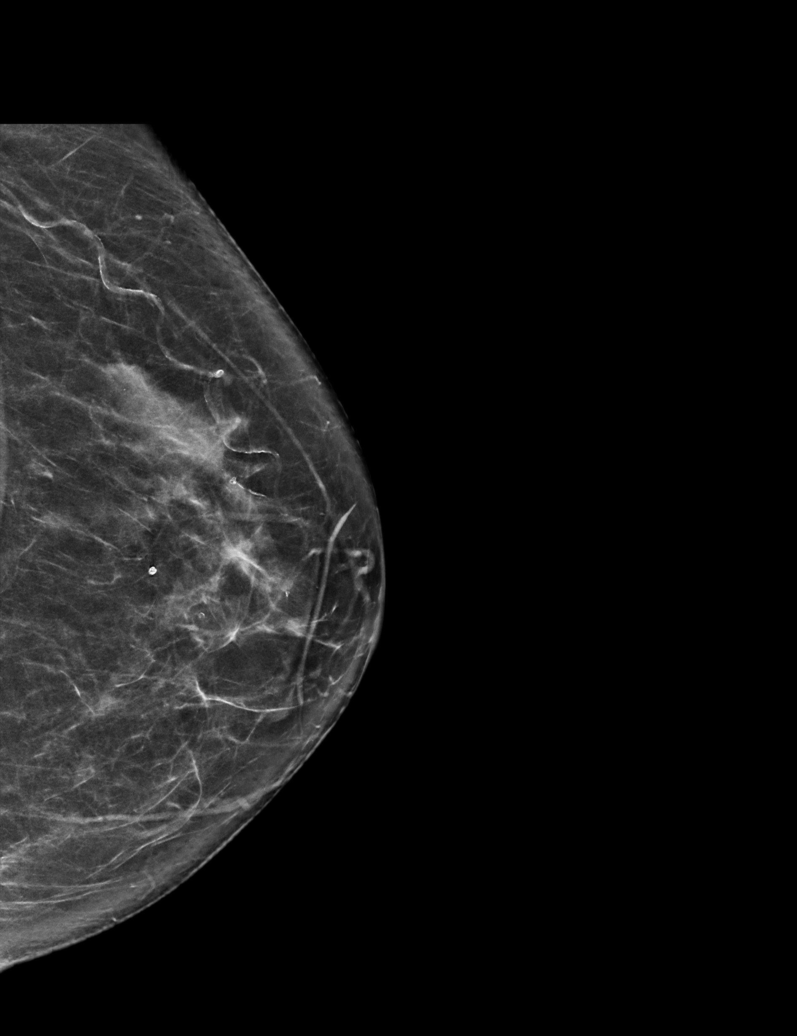

[R MLO tomo · tomo slice 31/61.0]
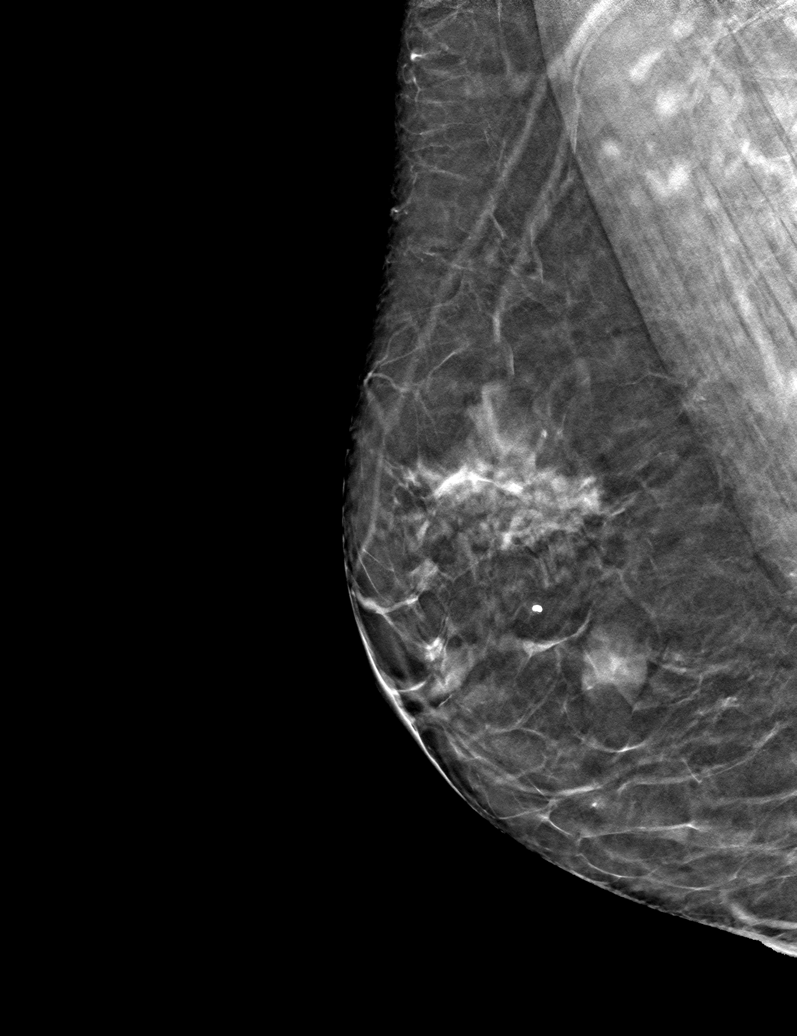

[6 of 30 positions shown; findings below may reference images not displayed]

ACR Breast Density Category b: There are scattered areas of
fibroglandular density.
FINDINGS: There are no findings suspicious for malignancy. Images were
processed with CAD.
IMPRESSION: No mammographic evidence of malignancy. A result letter of this
screening mammogram will be mailed directly to the patient.

RECOMMENDATION:
Screening mammogram in one year. (Code:CN-U-775)

BI-RADS CATEGORY  1: Negative.

## 2020-12-28 DIAGNOSIS — G5702 Lesion of sciatic nerve, left lower limb: Secondary | ICD-10-CM | POA: Diagnosis not present

## 2021-02-28 ENCOUNTER — Other Ambulatory Visit: Payer: Self-pay | Admitting: Family Medicine

## 2021-02-28 DIAGNOSIS — Z1231 Encounter for screening mammogram for malignant neoplasm of breast: Secondary | ICD-10-CM

## 2021-03-02 ENCOUNTER — Ambulatory Visit
Admission: RE | Admit: 2021-03-02 | Discharge: 2021-03-02 | Disposition: A | Discharge status: Home / Self Care | Payer: Medicare HMO | Source: Ambulatory Visit | Attending: Family Medicine | Admitting: Family Medicine

## 2021-03-02 ENCOUNTER — Other Ambulatory Visit: Payer: Self-pay

## 2021-03-02 DIAGNOSIS — Z1231 Encounter for screening mammogram for malignant neoplasm of breast: Secondary | ICD-10-CM | POA: Diagnosis not present

## 2021-03-21 DIAGNOSIS — E559 Vitamin D deficiency, unspecified: Secondary | ICD-10-CM | POA: Diagnosis not present

## 2021-03-21 DIAGNOSIS — Z131 Encounter for screening for diabetes mellitus: Secondary | ICD-10-CM | POA: Diagnosis not present

## 2021-03-25 DIAGNOSIS — Z0000 Encounter for general adult medical examination without abnormal findings: Secondary | ICD-10-CM | POA: Diagnosis not present

## 2021-03-25 DIAGNOSIS — E559 Vitamin D deficiency, unspecified: Secondary | ICD-10-CM | POA: Diagnosis not present

## 2021-03-25 DIAGNOSIS — Z13220 Encounter for screening for lipoid disorders: Secondary | ICD-10-CM | POA: Diagnosis not present

## 2021-03-25 DIAGNOSIS — M5430 Sciatica, unspecified side: Secondary | ICD-10-CM | POA: Diagnosis not present

## 2021-03-25 DIAGNOSIS — Z131 Encounter for screening for diabetes mellitus: Secondary | ICD-10-CM | POA: Diagnosis not present

## 2021-03-25 DIAGNOSIS — Z1331 Encounter for screening for depression: Secondary | ICD-10-CM | POA: Diagnosis not present

## 2021-03-25 DIAGNOSIS — G500 Trigeminal neuralgia: Secondary | ICD-10-CM | POA: Diagnosis not present

## 2021-03-25 DIAGNOSIS — M545 Low back pain, unspecified: Secondary | ICD-10-CM | POA: Diagnosis not present

## 2021-03-25 DIAGNOSIS — Z1389 Encounter for screening for other disorder: Secondary | ICD-10-CM | POA: Diagnosis not present

## 2021-07-15 DIAGNOSIS — H2513 Age-related nuclear cataract, bilateral: Secondary | ICD-10-CM | POA: Diagnosis not present

## 2021-07-15 DIAGNOSIS — H40013 Open angle with borderline findings, low risk, bilateral: Secondary | ICD-10-CM | POA: Diagnosis not present

## 2021-07-15 DIAGNOSIS — H04123 Dry eye syndrome of bilateral lacrimal glands: Secondary | ICD-10-CM | POA: Diagnosis not present

## 2021-07-15 DIAGNOSIS — H35363 Drusen (degenerative) of macula, bilateral: Secondary | ICD-10-CM | POA: Diagnosis not present

## 2022-03-06 ENCOUNTER — Other Ambulatory Visit: Payer: Self-pay | Admitting: Family Medicine

## 2022-03-06 DIAGNOSIS — Z1231 Encounter for screening mammogram for malignant neoplasm of breast: Secondary | ICD-10-CM

## 2022-03-31 DIAGNOSIS — Z131 Encounter for screening for diabetes mellitus: Secondary | ICD-10-CM | POA: Diagnosis not present

## 2022-03-31 DIAGNOSIS — Z13220 Encounter for screening for lipoid disorders: Secondary | ICD-10-CM | POA: Diagnosis not present

## 2022-03-31 DIAGNOSIS — Z136 Encounter for screening for cardiovascular disorders: Secondary | ICD-10-CM | POA: Diagnosis not present

## 2022-03-31 DIAGNOSIS — E559 Vitamin D deficiency, unspecified: Secondary | ICD-10-CM | POA: Diagnosis not present

## 2022-04-07 DIAGNOSIS — Z8669 Personal history of other diseases of the nervous system and sense organs: Secondary | ICD-10-CM | POA: Diagnosis not present

## 2022-04-07 DIAGNOSIS — Z131 Encounter for screening for diabetes mellitus: Secondary | ICD-10-CM | POA: Diagnosis not present

## 2022-04-07 DIAGNOSIS — E559 Vitamin D deficiency, unspecified: Secondary | ICD-10-CM | POA: Diagnosis not present

## 2022-04-07 DIAGNOSIS — Z0000 Encounter for general adult medical examination without abnormal findings: Secondary | ICD-10-CM | POA: Diagnosis not present

## 2022-04-07 DIAGNOSIS — R030 Elevated blood-pressure reading, without diagnosis of hypertension: Secondary | ICD-10-CM | POA: Diagnosis not present

## 2022-04-07 DIAGNOSIS — Z1331 Encounter for screening for depression: Secondary | ICD-10-CM | POA: Diagnosis not present

## 2022-04-07 DIAGNOSIS — H9190 Unspecified hearing loss, unspecified ear: Secondary | ICD-10-CM | POA: Diagnosis not present

## 2022-04-07 DIAGNOSIS — R32 Unspecified urinary incontinence: Secondary | ICD-10-CM | POA: Diagnosis not present

## 2022-04-07 DIAGNOSIS — Z6826 Body mass index (BMI) 26.0-26.9, adult: Secondary | ICD-10-CM | POA: Diagnosis not present

## 2022-04-21 ENCOUNTER — Ambulatory Visit
Admission: RE | Admit: 2022-04-21 | Discharge: 2022-04-21 | Disposition: A | Discharge status: Home / Self Care | Payer: Medicare HMO | Source: Ambulatory Visit | Attending: Family Medicine | Admitting: Family Medicine

## 2022-04-21 DIAGNOSIS — Z1231 Encounter for screening mammogram for malignant neoplasm of breast: Secondary | ICD-10-CM

## 2022-07-21 DIAGNOSIS — H2513 Age-related nuclear cataract, bilateral: Secondary | ICD-10-CM | POA: Diagnosis not present

## 2022-07-21 DIAGNOSIS — H04123 Dry eye syndrome of bilateral lacrimal glands: Secondary | ICD-10-CM | POA: Diagnosis not present

## 2022-07-21 DIAGNOSIS — H40023 Open angle with borderline findings, high risk, bilateral: Secondary | ICD-10-CM | POA: Diagnosis not present

## 2022-07-21 DIAGNOSIS — H35363 Drusen (degenerative) of macula, bilateral: Secondary | ICD-10-CM | POA: Diagnosis not present

## 2023-04-06 DIAGNOSIS — Z131 Encounter for screening for diabetes mellitus: Secondary | ICD-10-CM | POA: Diagnosis not present

## 2023-04-06 DIAGNOSIS — E559 Vitamin D deficiency, unspecified: Secondary | ICD-10-CM | POA: Diagnosis not present

## 2023-04-16 ENCOUNTER — Other Ambulatory Visit: Payer: Self-pay | Admitting: Family Medicine

## 2023-04-16 DIAGNOSIS — Z1231 Encounter for screening mammogram for malignant neoplasm of breast: Secondary | ICD-10-CM

## 2023-04-18 DIAGNOSIS — M25551 Pain in right hip: Secondary | ICD-10-CM | POA: Diagnosis not present

## 2023-04-18 DIAGNOSIS — Z0000 Encounter for general adult medical examination without abnormal findings: Secondary | ICD-10-CM | POA: Diagnosis not present

## 2023-04-18 DIAGNOSIS — Z131 Encounter for screening for diabetes mellitus: Secondary | ICD-10-CM | POA: Diagnosis not present

## 2023-04-18 DIAGNOSIS — E559 Vitamin D deficiency, unspecified: Secondary | ICD-10-CM | POA: Diagnosis not present

## 2023-04-18 DIAGNOSIS — Z23 Encounter for immunization: Secondary | ICD-10-CM | POA: Diagnosis not present

## 2023-04-18 DIAGNOSIS — Z8669 Personal history of other diseases of the nervous system and sense organs: Secondary | ICD-10-CM | POA: Diagnosis not present

## 2023-04-18 DIAGNOSIS — Z01419 Encounter for gynecological examination (general) (routine) without abnormal findings: Secondary | ICD-10-CM | POA: Diagnosis not present

## 2023-05-17 ENCOUNTER — Ambulatory Visit
Admission: RE | Admit: 2023-05-17 | Discharge: 2023-05-17 | Disposition: A | Discharge status: Home / Self Care | Payer: Medicare HMO | Source: Ambulatory Visit | Attending: Family Medicine | Admitting: Family Medicine

## 2023-05-17 DIAGNOSIS — Z1231 Encounter for screening mammogram for malignant neoplasm of breast: Secondary | ICD-10-CM

## 2023-07-27 DIAGNOSIS — H04123 Dry eye syndrome of bilateral lacrimal glands: Secondary | ICD-10-CM | POA: Diagnosis not present

## 2023-07-27 DIAGNOSIS — H40023 Open angle with borderline findings, high risk, bilateral: Secondary | ICD-10-CM | POA: Diagnosis not present

## 2023-07-27 DIAGNOSIS — H2513 Age-related nuclear cataract, bilateral: Secondary | ICD-10-CM | POA: Diagnosis not present

## 2023-07-27 DIAGNOSIS — H524 Presbyopia: Secondary | ICD-10-CM | POA: Diagnosis not present

## 2023-07-27 DIAGNOSIS — H35363 Drusen (degenerative) of macula, bilateral: Secondary | ICD-10-CM | POA: Diagnosis not present

## 2023-10-29 DIAGNOSIS — H04123 Dry eye syndrome of bilateral lacrimal glands: Secondary | ICD-10-CM | POA: Diagnosis not present

## 2023-10-29 DIAGNOSIS — H00025 Hordeolum internum left lower eyelid: Secondary | ICD-10-CM | POA: Diagnosis not present

## 2024-01-17 DIAGNOSIS — G500 Trigeminal neuralgia: Secondary | ICD-10-CM | POA: Diagnosis not present

## 2024-01-24 DIAGNOSIS — Z6825 Body mass index (BMI) 25.0-25.9, adult: Secondary | ICD-10-CM | POA: Diagnosis not present

## 2024-01-24 DIAGNOSIS — G500 Trigeminal neuralgia: Secondary | ICD-10-CM | POA: Diagnosis not present

## 2024-02-15 ENCOUNTER — Emergency Department (HOSPITAL_BASED_OUTPATIENT_CLINIC_OR_DEPARTMENT_OTHER)

## 2024-02-15 ENCOUNTER — Encounter (HOSPITAL_BASED_OUTPATIENT_CLINIC_OR_DEPARTMENT_OTHER): Payer: Self-pay | Admitting: Emergency Medicine

## 2024-02-15 ENCOUNTER — Other Ambulatory Visit: Payer: Self-pay

## 2024-02-15 ENCOUNTER — Emergency Department (HOSPITAL_BASED_OUTPATIENT_CLINIC_OR_DEPARTMENT_OTHER)
Admission: EM | Admit: 2024-02-15 | Discharge: 2024-02-15 | Disposition: A | Discharge status: Home / Self Care | Attending: Emergency Medicine | Admitting: Emergency Medicine

## 2024-02-15 DIAGNOSIS — R9431 Abnormal electrocardiogram [ECG] [EKG]: Secondary | ICD-10-CM | POA: Diagnosis not present

## 2024-02-15 DIAGNOSIS — G4441 Drug-induced headache, not elsewhere classified, intractable: Secondary | ICD-10-CM | POA: Diagnosis not present

## 2024-02-15 DIAGNOSIS — R519 Headache, unspecified: Secondary | ICD-10-CM | POA: Diagnosis not present

## 2024-02-15 DIAGNOSIS — R7989 Other specified abnormal findings of blood chemistry: Secondary | ICD-10-CM | POA: Insufficient documentation

## 2024-02-15 DIAGNOSIS — G500 Trigeminal neuralgia: Secondary | ICD-10-CM | POA: Insufficient documentation

## 2024-02-15 DIAGNOSIS — R748 Abnormal levels of other serum enzymes: Secondary | ICD-10-CM | POA: Diagnosis not present

## 2024-02-15 DIAGNOSIS — Z6826 Body mass index (BMI) 26.0-26.9, adult: Secondary | ICD-10-CM | POA: Diagnosis not present

## 2024-02-15 HISTORY — DX: Arthralgia of temporomandibular joint, unspecified side: M26.629

## 2024-02-15 LAB — CBC WITH DIFFERENTIAL/PLATELET
Abs Immature Granulocytes: 0.02 K/uL (ref 0.00–0.07)
Basophils Absolute: 0.1 K/uL (ref 0.0–0.1)
Basophils Relative: 1 %
Eosinophils Absolute: 0.3 K/uL (ref 0.0–0.5)
Eosinophils Relative: 5 %
HCT: 35.5 % — ABNORMAL LOW (ref 36.0–46.0)
Hemoglobin: 12.1 g/dL (ref 12.0–15.0)
Immature Granulocytes: 0 %
Lymphocytes Relative: 9 %
Lymphs Abs: 0.6 K/uL — ABNORMAL LOW (ref 0.7–4.0)
MCH: 30.1 pg (ref 26.0–34.0)
MCHC: 34.1 g/dL (ref 30.0–36.0)
MCV: 88.3 fL (ref 80.0–100.0)
Monocytes Absolute: 0.7 K/uL (ref 0.1–1.0)
Monocytes Relative: 10 %
Neutro Abs: 4.7 K/uL (ref 1.7–7.7)
Neutrophils Relative %: 75 %
Platelets: 223 K/uL (ref 150–400)
RBC: 4.02 MIL/uL (ref 3.87–5.11)
RDW: 12.9 % (ref 11.5–15.5)
WBC: 6.4 K/uL (ref 4.0–10.5)
nRBC: 0 % (ref 0.0–0.2)

## 2024-02-15 LAB — RESP PANEL BY RT-PCR (RSV, FLU A&B, COVID)  RVPGX2
Influenza A by PCR: NEGATIVE
Influenza B by PCR: NEGATIVE
Resp Syncytial Virus by PCR: NEGATIVE
SARS Coronavirus 2 by RT PCR: NEGATIVE

## 2024-02-15 LAB — COMPREHENSIVE METABOLIC PANEL WITH GFR
ALT: 195 U/L — ABNORMAL HIGH (ref 0–44)
AST: 84 U/L — ABNORMAL HIGH (ref 15–41)
Albumin: 3.8 g/dL (ref 3.5–5.0)
Alkaline Phosphatase: 210 U/L — ABNORMAL HIGH (ref 38–126)
Anion gap: 11 (ref 5–15)
BUN: 11 mg/dL (ref 8–23)
CO2: 26 mmol/L (ref 22–32)
Calcium: 8.5 mg/dL — ABNORMAL LOW (ref 8.9–10.3)
Chloride: 100 mmol/L (ref 98–111)
Creatinine, Ser: 0.73 mg/dL (ref 0.44–1.00)
GFR, Estimated: >60 mL/min (ref >60)
Glucose, Bld: 100 mg/dL — ABNORMAL HIGH (ref 70–99)
Potassium: 3.6 mmol/L (ref 3.5–5.1)
Sodium: 137 mmol/L (ref 135–145)
Total Bilirubin: 0.8 mg/dL (ref 0.0–1.2)
Total Protein: 7.1 g/dL (ref 6.5–8.1)

## 2024-02-15 LAB — ACETAMINOPHEN LEVEL: Acetaminophen (Tylenol), Serum: 11 ug/mL (ref 10–30)

## 2024-02-15 LAB — C-REACTIVE PROTEIN: CRP: 8.9 mg/dL — ABNORMAL HIGH (ref <1.0)

## 2024-02-15 LAB — SEDIMENTATION RATE: Sed Rate: 57 mm/h — ABNORMAL HIGH (ref 0–22)

## 2024-02-15 MED ORDER — LACTATED RINGERS IV BOLUS
1000.0000 mL | Freq: Once | INTRAVENOUS | Status: AC
  Administered 2024-02-15: 1000 mL via INTRAVENOUS

## 2024-02-15 MED ORDER — KETOROLAC TROMETHAMINE 15 MG/ML IJ SOLN
15.0000 mg | Freq: Once | INTRAMUSCULAR | Status: AC
  Administered 2024-02-15: 15 mg via INTRAVENOUS
  Filled 2024-02-15: qty 1

## 2024-02-15 MED ORDER — PROCHLORPERAZINE EDISYLATE 10 MG/2ML IJ SOLN
5.0000 mg | Freq: Once | INTRAMUSCULAR | Status: AC
  Administered 2024-02-15: 5 mg via INTRAVENOUS
  Filled 2024-02-15: qty 2

## 2024-02-15 MED ORDER — DEXAMETHASONE SODIUM PHOSPHATE 10 MG/ML IJ SOLN
10.0000 mg | Freq: Once | INTRAMUSCULAR | Status: AC
  Administered 2024-02-15: 10 mg via INTRAVENOUS
  Filled 2024-02-15: qty 1

## 2024-02-15 MED ORDER — ACETAMINOPHEN 500 MG PO TABS
1000.0000 mg | ORAL_TABLET | Freq: Once | ORAL | Status: AC
  Administered 2024-02-15: 1000 mg via ORAL
  Filled 2024-02-15: qty 2

## 2024-02-15 MED ORDER — MAGNESIUM SULFATE 2 GM/50ML IV SOLN
2.0000 g | Freq: Once | INTRAVENOUS | Status: AC
  Administered 2024-02-15: 2 g via INTRAVENOUS
  Filled 2024-02-15: qty 50

## 2024-02-15 NOTE — ED Provider Notes (Signed)
 [name] Provider Note   CSN: [id number] Arrival date & time: 02/15/24  1028     History No chief complaint on file.   HPI: [name] is a 73 y.o. female with history pertinent trigeminal neuralgia who presents complaining of headache. Patient arrived via POV accompanied by husband, coming from primary care.  History provided by patient, spouse/partner, and PCP, Breanna Pay, MD over the phone.  No interpreter required during this encounter.  Prior to arrival of patient, did receive a call from patient's primary care physician that she was sending the patient to the emergency department via POV for evaluation.  Reports that patient does not have a history of chronic headaches, however was seen in clinic today for 3 days of severe headache in the setting of recently being started on carbamazepine in August for trigeminal neuralgia.  Dr. Pay expresses concern for need for imaging, as well as concerns regarding possible hyponatremia.  Patient presents accompanied by husband, states that she has a history of trigeminal neuralgia, several years ago underwent gamma knife and had resolution.  However trigeminal neuralgia recurred in mid August.  Reports that she was initially started on carbamazepine at the end of August, and approximately 2 weeks ago it was increased.  Reports that that at this time she developed a low-grade headache, does not have a history of frequent headaches.  Reports that initially headaches were responsive to Tylenol  and ibuprofen, however given persistence she and her PCP thought that it could be related to the carbamazepine, therefore her last dose was yesterday, however the headache has been persistent.  She denies any fever, chills, nausea, vomiting, diarrhea, abdominal pain, chest pain, shortness of breath.  Patient's recorded medical, surgical, social, medication list and allergies were reviewed in the Snapshot  window as part of the initial history.   Prior to Admission medications   Not on File     Allergies: Patient has no allergy information on record.   Review of Systems   ROS as per HPI  Physical Exam Updated Vital Signs There were no vitals taken for this visit. Physical Exam Vitals and nursing note reviewed.  Constitutional:      General: She is not in acute distress.    Appearance: Normal appearance.  HENT:     Head: Normocephalic and atraumatic.  Eyes:     Extraocular Movements: Extraocular movements intact.     Right eye: Normal extraocular motion and no nystagmus.     Left eye: Normal extraocular motion and no nystagmus.     Pupils: Pupils are equal, round, and reactive to light.  Neck:     Meningeal: Brudzinski's sign and Kernig's sign absent.  Cardiovascular:     Rate and Rhythm: Normal rate.     Pulses: Normal pulses.  Pulmonary:     Effort: Pulmonary effort is normal.  Abdominal:     Palpations: Abdomen is soft. There is no mass.     Tenderness: There is no abdominal tenderness. There is no guarding.  Musculoskeletal:     Cervical back: Normal range of motion. No rigidity.  Skin:    General: Skin is warm and dry.     Capillary Refill: Capillary refill takes less than 2 seconds.  Neurological:     General: No focal deficit present.     Mental Status: She is alert and oriented to person, place, and time.     GCS: GCS eye subscore is 4. GCS verbal subscore  is 5. GCS motor subscore is 6.     Cranial Nerves: No cranial nerve deficit, dysarthria or facial asymmetry.     Sensory: No sensory deficit.     Motor: No weakness.     Coordination: Coordination normal.     ED Course/ Medical Decision Making/ A&P  Procedures Procedures   Medications Ordered in ED Medications - No data to display  Medical Decision Making:   [name] is a 73 y.o. female who presents for headache as per above.  Physical exam is pertinent for patient with reassuring physical  exam including neurologic exam, afebrile.  The differential includes but is not limited to tension headache, rebound headache, medication side effect, AKI, emergent electrolyte derangement, hyponatremia, ICH, mass lesion.  Independent historian: {LSHISTORIAN:33403}  External data reviewed: {LSEXTERNALDATA:33407}  Initial Plan:  ***  ***Screening labs including CBC and Metabolic panel to evaluate for infectious or metabolic etiology of disease.  ***Urinalysis with reflex culture ordered to evaluate for UTI or relevant urologic/nephrologic pathology.  ***CXR to evaluate for structural/infectious intrathoracic pathology.  {crccardiactesting:32591::EKG to evaluate for cardiac pathology} Objective evaluation as below reviewed   Labs: {LSLABS:33416}  Radiology: {LSRADS:33417} No results found.  EKG/Medicine tests: {LSEKG:33414} EKG Interpretation:    Decision rules:  {Document cardiac monitor, telemetry assessment procedure when appropriate:1} {   Click here for ABCD2, HEART and other calculatorsREFRESH Note before signing :1}   {Document critical care time when appropriate:1} {Document review of labs and clinical decision tools ie heart score, Chads2Vasc2 etc:1}  {Document your independent review of radiology images, and any outside records:1} {Document your discussion with family members, caretakers, and with consultants:1} {Document social determinants of health affecting pt's care:1} {Document your decision making why or why not admission, treatments were needed:1}                Interventions:***   See the EMR for full details regarding lab and imaging results.  ***  {LSCOPA:33420}  Discussion of management or test interpretations with external provider(s): ***  Risk Drugs:{LSDRUGS:33399} Treatment: {LSTREATMENT:33409} Surgery:{LSSURGERY:33410} Critical Care: ***  Disposition: {LSDISPO:33388}  MDM generated using voice dictation software and may contain  dictation errors.  Please contact me for any clarification or with any questions.  Clinical Impression: No diagnosis found.   Data Unavailable   Final Clinical Impression(s) / ED Diagnoses Final diagnoses:  None    Rx / DC Orders ED Discharge Orders     None

## 2024-02-15 NOTE — ED Triage Notes (Signed)
 H/a since Tuesday has not gotten any better ,  saw the dr today had labs drawn and a , slight mausea

## 2024-02-15 NOTE — Discharge Instructions (Signed)
 [name]  Thank you for allowing us  to take care of you today.  You came to the Emergency Department today because you have a severe headache in the setting of recently being started on carbamazepine.  Here in the emergency department your sodium was normal, and your CT of your head did not show any acute abnormality such as bleeding.  You initially did not get better after first round of migraine medications, therefore we gave you another round and you are feeling much better.  You should follow-up with neurology in clinic, we are giving you a referral to follow-up with [facility] neurology.  Please also follow-up with your primary care doctor, if you develop a recurrent or worsening headache or visual changes, please come back to the emergency department.  To-Do: 1. Please follow-up with your primary doctor within 5 days / as soon as possible.    Please return to the Emergency Department or call 911 if you experience have worsening of your symptoms, or do not get better, repeat severe headache, visual changes, chest pain, shortness of breath, severe or significantly worsening pain, high fever, severe confusion, pass out or have any reason to think that you need emergency medical care.   We hope you feel better soon.   Mitzie Later, MD Department of Emergency Medicine [facility]

## 2024-02-25 ENCOUNTER — Encounter: Payer: Self-pay | Admitting: Neurology

## 2024-02-25 ENCOUNTER — Ambulatory Visit: Admitting: Neurology

## 2024-02-25 VITALS — BP 118/70 | HR 63 | Ht 69.0 in | Wt 171.8 lb

## 2024-02-25 DIAGNOSIS — Z789 Other specified health status: Secondary | ICD-10-CM | POA: Diagnosis not present

## 2024-02-25 DIAGNOSIS — G500 Trigeminal neuralgia: Secondary | ICD-10-CM | POA: Diagnosis not present

## 2024-02-25 DIAGNOSIS — Z923 Personal history of irradiation: Secondary | ICD-10-CM

## 2024-02-25 DIAGNOSIS — R519 Headache, unspecified: Secondary | ICD-10-CM

## 2024-02-25 DIAGNOSIS — R700 Elevated erythrocyte sedimentation rate: Secondary | ICD-10-CM

## 2024-02-25 DIAGNOSIS — R748 Abnormal levels of other serum enzymes: Secondary | ICD-10-CM | POA: Diagnosis not present

## 2024-02-25 DIAGNOSIS — R7982 Elevated C-reactive protein (CRP): Secondary | ICD-10-CM

## 2024-02-25 NOTE — Progress Notes (Addendum)
 Subjective:    Patient ID: [name] is a 73 y.o. female.  HPI     True Mar, MD, PhD [facility] [address] [address] [place], [place] [address]  I saw patient, [name], as a referral from the emergency room for evaluation of her headache.  The patient is accompanied by her husband today.  [name] is a 73 year old female with an underlying medical history of trigeminal neuralgia, status post gamma knife, vitamin D deficiency, elevated liver enzymes, and mild obesity, who reports feeling at baseline and having had 1 episode of headache which made her go to the emergency room.  She reports that she has had no further headaches since then.  She denies recurrent migraines.  She does have a longstanding history of trigeminal neuralgia but it improved significantly after her gamma knife several years back.  I reviewed encounter records with neurosurgery from 2021.  She did have a flareup in August 2025 and was started on carbamazepine at the time but feels it makes her tired so she actually stopped it on 02/13/2024.  She was up to 200 mg twice daily.  She is currently off of it.  She has a prescription for tramadol 50 mg as needed but has not had to take it.  She has an appointment with her PCP coming up this week.  She is not aware of her inflammatory markers being elevated in the past.  She denies any visual symptoms such as blurry vision or loss of vision or double vision.  She is typically up-to-date with her eye examination, had her last appointment earlier this year and has her next appointment for her yearly checkup in or around February 2026.  [facility] goes to [facility] and sees Dr. Marcey.  She has corrective eyeglasses which seem up-to-date to her.  She denies any sudden onset of one-sided weakness or numbness or tingling or droopy face or slurring of speech.  She does have occasional twinges with her trigeminal neuralgia on the left side but not  like the flareup in August.  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  She does not like to drink water by self admission.  She drinks soda daily about 24 or 36 ounces from [employer], 2 to 3/day.  She does not drink any alcohol.  She does not smoke.  She sleeps fairly well, occasional snoring is reported, no recurrent nocturnal or morning headaches, has nocturia about once per average night.  She declines a sleep study today.  She presented to the emergency room on 02/15/2024 with a headache.  She has been on carbamazepine.  I reviewed the emergency room records.  She was treated symptomatically with Tylenol , Toradol , Compazine , Decadron , and magnesium . She had a head CT without contrast on 02/15/2024 and I reviewed the results:  IMPRESSION: 1. No acute intracranial abnormality.   In addition, I personally and independently reviewed images through the PACS system.  I reviewed blood test results.  Tylenol  level was 11, ESR mildly elevated at 57, CRP mildly elevated at 8.9, CMP showed elevated alk phos at 210, elevated ALT at 195, elevated AST at 84.  Calcium below normal at 8.5, glucose was 100, BUN 11, creatinine 0.73.  There was initial concern of hyponatremia because of carbamazepine, sodium level was normal at 137.  Potassium was 3.6.  She had a brain MRI with and without contrast and MR angiogram of the  head without contrast on 06/12/2010 and I reviewed the results:   IMPRESSION:  1. Normal variant MRA circle of Willis.  2.  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   She has been followed by Dr. Yancy Fare at [facility] neurosurgery.  Her Past Medical History Is Significant For: Past Medical History:  Diagnosis Date   TMJ pain dysfunction syndrome     Her Past Surgical History Is Significant For: Past  Surgical History:  Procedure Laterality Date   BREAST EXCISIONAL BIOPSY Left     Her Family History Is Significant For: Family History  Problem Relation Age of Onset   Migraines Neg Hx     Her Social History Is Significant For: Social History   Socioeconomic History   Marital status: Married    Spouse name: Not on file   Number of children: Not on file   Years of education: Not on file   Highest education level: Not on file  Occupational History   Not on file  Tobacco Use   Smoking status: Never   Smokeless tobacco: Never  Vaping Use   Vaping status: Never Used  Substance and Sexual Activity   Alcohol use: Never   Drug use: Never   Sexual activity: Not on file  Other Topics Concern   Not on file  Social History Narrative   1-2 cups of diet coke, caffeine free tea    Social Drivers of Health   Financial Resource Strain: Not on file  Food Insecurity: Not on file  Transportation Needs: Not on file  Physical Activity: Not on file  Stress: No Stress Concern Present (11/15/2017)   Received from [facility]   [employer] of Occupational Health - Occupational Stress Questionnaire    Feeling of Stress : Only a little  Social Connections: Unknown (10/03/2021)   Received from [facility]   Social Network    Social Network: Not on file    Her Allergies Are:  Allergies  Allergen Reactions   Carbamazepine    Gabapentin   :   Her Current Medications Are:  Outpatient Encounter Medications as of 02/25/2024  Medication Sig   calcium citrate (CALCITRATE - DOSED IN MG ELEMENTAL CALCIUM) 950 (200 Ca) MG tablet Take 2 tablets by mouth daily.   cholecalciferol (VITAMIN D3) 25 MCG (1000 UNIT) tablet Take 1,000 Units by mouth daily.   No facility-administered encounter medications on file as of 02/25/2024.  :   Review of Systems:  Out of a complete 14 point review of systems, all are reviewed and negative with the exception of these symptoms as  listed below:   Review of Systems  Neurological:        Patient is here with husband for bad headaches - only one headache when she was in the hospital september 26 did the Referral from the ER CT scan was clear .  Last migraine was 45 years ago with pregnancy  ESS- 5 FSS- 48     Objective:  Neurological Exam  Physical Exam Physical Examination:   Vitals:   02/25/24 0908  BP: 118/70  Pulse: 63  SpO2: 99%    General Examination: The patient is a very pleasant 73 y.o. female in no acute distress. She appears well-developed and well-nourished and well groomed.   HEENT: Normocephalic, atraumatic, pupils are equal, round and reactive to light, extraocular  tracking is good without limitation to gaze excursion or nystagmus noted. No photophobia.  Funduscopic exam benign.  Corrective eye glasses in place. Hearing is grossly intact.  Face is symmetric with normal facial animation and facial sensation to light touch, temperature and vibration sense. Speech is clear without dysarthria. There is no hypophonia. There is no lip, neck/head, jaw or voice tremor. Neck is supple with full range of passive and active motion. There are no carotid bruits on auscultation.  Airway/Oropharynx exam reveals: Mild to moderate mouth dryness, adequate dental hygiene, tongue protrudes centrally and palate elevates symmetrically, small airway entry.   Chest: Clear to auscultation without wheezing, rhonchi or crackles noted.  Heart: S1+S2+0, regular and normal without murmurs, rubs or gallops noted.   Abdomen: Soft, non-tender and non-distended.  Extremities: There is no pitting edema in the distal lower extremities bilaterally.   Skin: Warm and dry without trophic changes noted.   Musculoskeletal: exam reveals no obvious joint deformities.   Neurologically:  Mental status: The patient is awake, alert and oriented in all 4 spheres. Her immediate and remote memory, attention, language skills and fund of  knowledge are appropriate. There is no evidence of aphasia, agnosia, apraxia or anomia. Speech is clear with normal prosody and enunciation. Thought process is linear. Mood is normal and affect is normal.  Cranial nerves II - XII are as described above under HEENT exam.  Motor exam: Normal bulk, strength and tone is noted. There is no obvious action or resting tremor.  No drift or rebound, no postural or intention tremor. Fine motor skills and coordination: Intact with finger taps, hand movements and rapid alternating patterning with both upper extremities, normal foot taps bilaterally in the lower extremities.  Romberg negative. Reflexes 1+ throughout, toes are downgoing bilaterally. Cerebellar testing: No dysmetria or intention tremor. There is no truncal or gait ataxia.  Normal finger-to-nose, normal heel-to-shin bilaterally. Sensory exam: intact to light touch in the upper and lower extremities.  Gait, station and balance: She stands easily. No veering to one side is noted. No leaning to one side is noted. Posture is age-appropriate and stance is narrow based. Gait shows normal stride length and normal pace. No problems turning are noted.  Normal tandem walk for age.  Assessment and Plan:   In summary, Uriyah Massimo is a very pleasant 73 y.o.-year old female with an underlying medical history of trigeminal neuralgia, status post gamma knife, vitamin D deficiency, elevated liver enzymes, and mild obesity, who presents for evaluation of her headache.  She had 1 episode of headache late last month and history is not compelling for giant cell arteritis or migraine.  She may have had a flare with her trigeminal neuralgia.  She is encouraged to maintain low-dose carbamazepine at 100 mg twice daily rather than 200 mg twice daily if she can.  She can certainly utilize tramadol as needed.  We talked about headache contributors and alleviating factors.  She is encouraged to decrease her caffeine intake, she  is encouraged to stay better hydrated with water, try to aim for 64 ounces of water per day.  We talked about sleep testing, she declines a sleep study at this time but is encouraged to think about it.  She had CT scans while in the ER, we will proceed with a brain MRI with and without contrast to rule out a structural cause of her headache.  She is advised to follow-up with her PCP as scheduled and discuss elevated liver enzymes and  mildly elevated inflammatory markers with her.  So long as the brain MRI shows age-appropriate and reassuring findings, we can follow-up in this clinic as needed.  I answered all the questions today the patient and her husband were in agreement.  If she changes her mind regarding sleep testing she is welcome to call our office.  I explained to them that obstructive sleep apnea can cause recurrent headaches.  Addendum, 03/21/2024: Due to recent exacerbation of her trigeminal neuralgia with a significant flareup in August 2025 and new severe headache, I will order a face/trigeminal MRI with and without contrast to rule out a structural cause of her symptoms and to see if there is any structural evidence of possible worsening trigeminal neuralgia again. This was an extended visit of over 60 minutes with copious record review involved in considerable counseling and coordination of care.

## 2024-02-25 NOTE — Patient Instructions (Signed)
 It was nice to meet you both today.  Your recent headache may have been an exacerbation of your trigeminal neuralgia.  Contributing factors to headaches could be tendency towards suboptimal hydration with water, and excessive caffeine use.   I would recommend you consider using low-dose carbamazepine such as 100 mg twice daily through your PCP.  You can certainly utilize tramadol low-dose as needed for flareups.  We will proceed with a brain MRI with and without contrast and call you with the results.  If you change your mind regarding sleep testing, please feel free to reach out via MyChart message or phone call and I would be happy to order a sleep study.  As discussed, untreated obstructive sleep apnea can cause recurrent headaches.  Increase your water intake to about 64 ounces water per day.  Limit your caffeine to about 1-2 servings of caffeine per day, 8 ounce size each.  So long as your brain MRI shows benign and age-appropriate findings we can follow-up in this clinic as needed.

## 2024-02-26 ENCOUNTER — Telehealth: Payer: Self-pay | Admitting: Neurology

## 2024-02-26 NOTE — Telephone Encounter (Signed)
 sent to GI they obtain [facility] shara [phone]

## 2024-02-27 DIAGNOSIS — R700 Elevated erythrocyte sedimentation rate: Secondary | ICD-10-CM | POA: Diagnosis not present

## 2024-02-27 DIAGNOSIS — Z6826 Body mass index (BMI) 26.0-26.9, adult: Secondary | ICD-10-CM | POA: Diagnosis not present

## 2024-02-27 DIAGNOSIS — Z23 Encounter for immunization: Secondary | ICD-10-CM | POA: Diagnosis not present

## 2024-02-27 DIAGNOSIS — G500 Trigeminal neuralgia: Secondary | ICD-10-CM | POA: Diagnosis not present

## 2024-02-27 DIAGNOSIS — R519 Headache, unspecified: Secondary | ICD-10-CM | POA: Diagnosis not present

## 2024-03-11 ENCOUNTER — Encounter: Payer: Self-pay | Admitting: Neurology

## 2024-03-12 ENCOUNTER — Other Ambulatory Visit: Payer: Self-pay | Admitting: Neurology

## 2024-03-12 NOTE — Telephone Encounter (Signed)
 Her insurance denied the MRI brain and I called to try to get it approved and they said the only option is an appeal and you have to wait 60 days to submit a new prior authorization. She is scheduled at [facility] on 10/27. I submitted a new prior authorization for a MRI face/trigeminal w/wo since it's a different CPT code and that's what the diagnosis code is for and they approved it. Are you ok with changing the MRI order to MR face/trigeminal w/wo? Or we will have to wait 60 days to try the MRI brain w/wo again. Thanks!

## 2024-03-12 NOTE — Telephone Encounter (Signed)
 Placed order for MRI trigeminal nerve/face w/wo contrast. I would like to cancel MRI br for now and we can reconsider in the future if needed.

## 2024-03-12 NOTE — Telephone Encounter (Signed)
 I spoke to the patient and let her know that I was able to get this approved as a slightly different scan and that I will cancel her appointment at [facility] and have them call her to schedule at the [facility] in [facility] since it's closer to her home. [employer] shara: [id number] exp. 03/12/24-09/08/24 sent to [facility] [phone]

## 2024-03-12 NOTE — Telephone Encounter (Signed)
 Sounds good, thank you

## 2024-03-12 NOTE — Addendum Note (Signed)
 Addended by: Briar Sword on: 03/12/2024 03:29 PM   Modules accepted: Orders

## 2024-03-13 ENCOUNTER — Encounter: Payer: Self-pay | Admitting: Neurology

## 2024-03-13 NOTE — Telephone Encounter (Signed)
 She is scheduled at the [facility] 10/26

## 2024-03-16 ENCOUNTER — Ambulatory Visit (HOSPITAL_BASED_OUTPATIENT_CLINIC_OR_DEPARTMENT_OTHER)

## 2024-03-16 ENCOUNTER — Ambulatory Visit (HOSPITAL_BASED_OUTPATIENT_CLINIC_OR_DEPARTMENT_OTHER)
Admission: RE | Admit: 2024-03-16 | Discharge: 2024-03-16 | Disposition: A | Discharge status: Home / Self Care | Source: Ambulatory Visit | Attending: Neurology | Admitting: Neurology

## 2024-03-16 DIAGNOSIS — Z923 Personal history of irradiation: Secondary | ICD-10-CM | POA: Diagnosis not present

## 2024-03-16 DIAGNOSIS — G44009 Cluster headache syndrome, unspecified, not intractable: Secondary | ICD-10-CM | POA: Diagnosis not present

## 2024-03-16 DIAGNOSIS — G500 Trigeminal neuralgia: Secondary | ICD-10-CM | POA: Diagnosis not present

## 2024-03-16 DIAGNOSIS — R700 Elevated erythrocyte sedimentation rate: Secondary | ICD-10-CM | POA: Diagnosis not present

## 2024-03-16 DIAGNOSIS — R7982 Elevated C-reactive protein (CRP): Secondary | ICD-10-CM | POA: Diagnosis not present

## 2024-03-16 DIAGNOSIS — R519 Headache, unspecified: Secondary | ICD-10-CM | POA: Insufficient documentation

## 2024-03-16 DIAGNOSIS — Z789 Other specified health status: Secondary | ICD-10-CM | POA: Diagnosis not present

## 2024-03-16 DIAGNOSIS — R748 Abnormal levels of other serum enzymes: Secondary | ICD-10-CM | POA: Insufficient documentation

## 2024-03-16 MED ORDER — GADOBUTROL 1 MMOL/ML IV SOLN
7.5000 mL | Freq: Once | INTRAVENOUS | Status: AC | PRN
  Administered 2024-03-16: 7.5 mL via INTRAVENOUS

## 2024-03-17 ENCOUNTER — Other Ambulatory Visit

## 2024-03-21 ENCOUNTER — Ambulatory Visit: Payer: Self-pay | Admitting: Neurology

## 2024-04-16 DIAGNOSIS — R700 Elevated erythrocyte sedimentation rate: Secondary | ICD-10-CM | POA: Diagnosis not present

## 2024-04-16 DIAGNOSIS — E559 Vitamin D deficiency, unspecified: Secondary | ICD-10-CM | POA: Diagnosis not present

## 2024-04-16 DIAGNOSIS — Z131 Encounter for screening for diabetes mellitus: Secondary | ICD-10-CM | POA: Diagnosis not present

## 2024-04-28 ENCOUNTER — Other Ambulatory Visit (HOSPITAL_BASED_OUTPATIENT_CLINIC_OR_DEPARTMENT_OTHER): Payer: Self-pay | Admitting: Family Medicine

## 2024-04-28 DIAGNOSIS — E2839 Other primary ovarian failure: Secondary | ICD-10-CM
